# Patient Record
Sex: Male | Born: 1940
Health system: Southern US, Community
[De-identification: ages and names within clinical notes are randomized; demographics above are authoritative.]

## PROBLEM LIST (undated history)

## (undated) DIAGNOSIS — Z8619 Personal history of other infectious and parasitic diseases: Secondary | ICD-10-CM

## (undated) HISTORY — DX: Personal history of other infectious and parasitic diseases: Z86.19

---
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
## 1999-02-05 HISTORY — PX: LUMBAR SPINE SURGERY: SHX701

## 1999-09-03 ENCOUNTER — Ambulatory Visit (HOSPITAL_COMMUNITY): Admission: RE | Admit: 1999-09-03 | Discharge: 1999-09-03 | Payer: Self-pay | Admitting: Gastroenterology

## 2001-01-09 ENCOUNTER — Ambulatory Visit (HOSPITAL_COMMUNITY): Admission: RE | Admit: 2001-01-09 | Discharge: 2001-01-10 | Payer: Self-pay | Admitting: Neurosurgery

## 2001-01-09 ENCOUNTER — Encounter: Payer: Self-pay | Admitting: Neurosurgery

## 2003-02-03 ENCOUNTER — Encounter: Admission: RE | Admit: 2003-02-03 | Discharge: 2003-02-03 | Payer: Self-pay | Admitting: Internal Medicine

## 2003-05-16 ENCOUNTER — Encounter: Admission: RE | Admit: 2003-05-16 | Discharge: 2003-05-16 | Payer: Self-pay | Admitting: Internal Medicine

## 2003-05-20 ENCOUNTER — Encounter: Admission: RE | Admit: 2003-05-20 | Discharge: 2003-05-20 | Payer: Self-pay | Admitting: Internal Medicine

## 2005-02-13 ENCOUNTER — Encounter: Admission: RE | Admit: 2005-02-13 | Discharge: 2005-02-13 | Payer: Self-pay | Admitting: Internal Medicine

## 2006-03-26 ENCOUNTER — Ambulatory Visit (HOSPITAL_BASED_OUTPATIENT_CLINIC_OR_DEPARTMENT_OTHER): Admission: RE | Admit: 2006-03-26 | Discharge: 2006-03-26 | Payer: Self-pay | Admitting: Orthopedic Surgery

## 2006-11-04 ENCOUNTER — Ambulatory Visit: Payer: Self-pay | Admitting: Specialist

## 2007-02-05 HISTORY — PX: SHOULDER SURGERY: SHX246

## 2007-02-25 ENCOUNTER — Other Ambulatory Visit: Payer: Self-pay

## 2007-02-25 ENCOUNTER — Ambulatory Visit: Payer: Self-pay | Admitting: Specialist

## 2007-03-12 ENCOUNTER — Ambulatory Visit: Payer: Self-pay | Admitting: Specialist

## 2008-02-05 HISTORY — PX: HERNIA REPAIR: SHX51

## 2008-04-27 DIAGNOSIS — R6882 Decreased libido: Secondary | ICD-10-CM | POA: Insufficient documentation

## 2008-04-27 DIAGNOSIS — J309 Allergic rhinitis, unspecified: Secondary | ICD-10-CM | POA: Insufficient documentation

## 2008-04-28 DIAGNOSIS — Z800 Family history of malignant neoplasm of digestive organs: Secondary | ICD-10-CM | POA: Insufficient documentation

## 2008-05-06 DIAGNOSIS — E291 Testicular hypofunction: Secondary | ICD-10-CM | POA: Insufficient documentation

## 2008-09-01 DIAGNOSIS — K644 Residual hemorrhoidal skin tags: Secondary | ICD-10-CM | POA: Insufficient documentation

## 2009-01-31 ENCOUNTER — Ambulatory Visit: Payer: Self-pay | Admitting: General Surgery

## 2011-04-05 HISTORY — PX: CATARACT EXTRACTION: SUR2

## 2013-02-15 DIAGNOSIS — Z85828 Personal history of other malignant neoplasm of skin: Secondary | ICD-10-CM

## 2013-02-15 DIAGNOSIS — C4492 Squamous cell carcinoma of skin, unspecified: Secondary | ICD-10-CM

## 2013-02-15 HISTORY — DX: Squamous cell carcinoma of skin, unspecified: C44.92

## 2013-02-15 HISTORY — DX: Personal history of other malignant neoplasm of skin: Z85.828

## 2013-05-31 ENCOUNTER — Ambulatory Visit: Payer: Self-pay | Admitting: Gastroenterology

## 2013-05-31 LAB — HM COLONOSCOPY

## 2013-07-29 LAB — HEMOGLOBIN A1C: HEMOGLOBIN A1C: 5.8 % (ref 4.0–6.0)

## 2013-07-29 LAB — LIPID PANEL
Cholesterol: 195 mg/dL (ref 0–200)
HDL: 35 mg/dL (ref 35–70)
LDL Cholesterol: 126 mg/dL
TRIGLYCERIDES: 170 mg/dL — AB (ref 40–160)

## 2013-07-29 LAB — PSA: PSA: 0.5

## 2014-08-29 ENCOUNTER — Encounter: Payer: Self-pay | Admitting: Family Medicine

## 2014-08-29 ENCOUNTER — Ambulatory Visit (INDEPENDENT_AMBULATORY_CARE_PROVIDER_SITE_OTHER): Payer: Medicare Other | Admitting: Family Medicine

## 2014-08-29 VITALS — BP 124/64 | HR 70 | Temp 98.0°F | Resp 16 | Wt 154.0 lb

## 2014-08-29 DIAGNOSIS — S86812A Strain of other muscle(s) and tendon(s) at lower leg level, left leg, initial encounter: Secondary | ICD-10-CM

## 2014-08-29 DIAGNOSIS — L03114 Cellulitis of left upper limb: Secondary | ICD-10-CM

## 2014-08-29 DIAGNOSIS — Z86010 Personal history of colonic polyps: Secondary | ICD-10-CM | POA: Insufficient documentation

## 2014-08-29 DIAGNOSIS — R7303 Prediabetes: Secondary | ICD-10-CM | POA: Insufficient documentation

## 2014-08-29 DIAGNOSIS — R42 Dizziness and giddiness: Secondary | ICD-10-CM | POA: Insufficient documentation

## 2014-08-29 DIAGNOSIS — S86912A Strain of unspecified muscle(s) and tendon(s) at lower leg level, left leg, initial encounter: Secondary | ICD-10-CM

## 2014-08-29 DIAGNOSIS — L989 Disorder of the skin and subcutaneous tissue, unspecified: Secondary | ICD-10-CM | POA: Insufficient documentation

## 2014-08-29 MED ORDER — CEPHALEXIN 500 MG PO CAPS: 500.0000 mg | ORAL_CAPSULE | Freq: Four times a day (QID) | ORAL | Status: AC

## 2014-08-29 NOTE — Progress Notes (Signed)
       Patient: CASANOVA SCHURMAN Male    DOB: [birth year]   74 y.o.   MRN: [id number] Visit Date: 08/29/2014  Today's Provider: Lelon Huh, MD   Chief Complaint  Patient presents with  . Knee Pain    x 1 week (left knee)   Subjective:    Knee Pain  Incident onset: 1 week. There was no injury mechanism. The pain is present in the left knee. The quality of the pain is described as aching. The pain is at a severity of 4/10. The pain has been intermittent since onset. Associated symptoms include tingling. Pertinent negatives include no inability to bear weight, loss of motion, loss of sensation, muscle weakness or numbness. The symptoms are aggravated by movement (bending the knee or walking). He has tried NSAIDs for the symptoms. The treatment provided mild relief.     Elbow pain.   Patient has swelling and pain in his left elbow. Elbow is tender to touch. Patient states the pain has worsened since it started 1 week ago. Patient denies any injury.   Allergies  Allergen Reactions  . Pollen Extract    Previous Medications   ASPIRIN 81 MG TABLET    Take 1 tablet by mouth daily.   CETIRIZINE HCL (ZYRTEC ALLERGY) 10 MG CAPS    Take 1 tablet by mouth daily.   FLUTICASONE (FLONASE) 50 MCG/ACT NASAL SPRAY    Place 2 sprays into both nostrils daily.   LORATADINE (CLARITIN) 10 MG TABLET    Take 1 tablet by mouth daily as needed.   MULTIPLE VITAMINS-MINERALS (MULTIVITAMIN ADULT PO)    Take 1 tablet by mouth daily.   OMEGA-3 FATTY ACIDS (FISH OIL PO)    Take 1 capsule by mouth daily.    Review of Systems  Constitutional: Negative for fever, chills, diaphoresis and fatigue.  Cardiovascular: Negative for leg swelling.  Musculoskeletal: Positive for joint swelling (left elbow), arthralgias (left knee and left elbow) and gait problem.  Neurological: Positive for tingling. Negative for numbness.    History  Substance Use Topics  . Smoking status: Former Smoker -- 1.00 packs/day for 2 years      Types: Cigarettes    Quit date: 02/04/1958  . Smokeless tobacco: Not on file  . Alcohol Use: 0.0 oz/week    0 Standard drinks or equivalent per week     Comment: occasional use; drinks hard liquor once a week or less   Objective:   BP 124/64 mmHg  Pulse 70  Temp(Src) 98 F (36.7 C) (Oral)  Resp 16  Wt 154 lb (69.854 kg)  SpO2 96%  Physical Exam   Slight erythema and tenderness just distal to left olecranon. FROM of elbow wrist and forearm  Mild tenderness inferior to right anterolateral patella. No swelling or erythema. FROM with normal strength of extension.      Assessment & Plan:     1. Cellulitis of arm, left  - cephALEXin (KEFLEX) 500 MG capsule; Take 1 capsule (500 mg total) by mouth 4 (four) times daily.  Dispense: 28 capsule; Refill: 0  2. Knee strain, left, initial encounter Recommend OTC elastic knee brace for 7-10 days.         Lelon Huh, MD  [facility]

## 2014-09-30 ENCOUNTER — Encounter: Payer: Self-pay | Admitting: Family Medicine

## 2014-09-30 ENCOUNTER — Ambulatory Visit (INDEPENDENT_AMBULATORY_CARE_PROVIDER_SITE_OTHER): Payer: Medicare Other | Admitting: Family Medicine

## 2014-09-30 VITALS — BP 118/76 | HR 76 | Temp 98.1°F | Resp 16 | Ht 67.5 in | Wt 154.8 lb

## 2014-09-30 DIAGNOSIS — M2550 Pain in unspecified joint: Secondary | ICD-10-CM

## 2014-09-30 DIAGNOSIS — Z125 Encounter for screening for malignant neoplasm of prostate: Secondary | ICD-10-CM

## 2014-09-30 DIAGNOSIS — Z0000 Encounter for general adult medical examination without abnormal findings: Secondary | ICD-10-CM

## 2014-09-30 DIAGNOSIS — M25532 Pain in left wrist: Secondary | ICD-10-CM

## 2014-09-30 DIAGNOSIS — M25531 Pain in right wrist: Secondary | ICD-10-CM

## 2014-09-30 DIAGNOSIS — R7309 Other abnormal glucose: Secondary | ICD-10-CM | POA: Diagnosis not present

## 2014-09-30 DIAGNOSIS — R7303 Prediabetes: Secondary | ICD-10-CM

## 2014-09-30 MED ORDER — PREDNISONE 10 MG PO TABS: ORAL_TABLET | ORAL | Status: AC

## 2014-09-30 NOTE — Patient Instructions (Addendum)
Please call office in September to schedule your flu shot and Prevnar-13 vaccine

## 2014-09-30 NOTE — Progress Notes (Signed)
Patient ID: Gregory Arroyo, male   DOB: 19-Jan-1941, 74 y.o.   MRN: [id number]       Patient: Gregory Arroyo, Male    DOB: 27-Jan-1941, 74 y.o.   MRN: [id number] Visit Date: 09/30/2014  Today's Provider: Lelon Huh, MD   Chief Complaint  Patient presents with  . Annual Exam    " 2 years ago" pt stated  . Wrist Pain    bilateral, started a week ago. Pain 6 out of a 10.   Subjective:    Annual physical exam Gregory Arroyo is a 74 y.o. male who presents today for health maintenance and complete physical. He feels well. 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 He reports he is sleeping poorly. Sleeping poorly because of the pain. -----------------------------------------------------------------   Joint pain He is concerned about migrating joint and muscle pains over the last few months. He was seen in July with pain in left elbow and left knee. He appeared to have some mild cellulitis in his elbow and was prescribed cephalexin. He states hat since then both pains have resolved, but he started having pain in both wrists a few weeks ago. The pain is worse in his left wrist, and is very severe in the morning. He states his wrists feel very stiff when he gets up in the morning and it take a few hours to lossen up.   Review of Systems  Constitutional: Negative.  Negative for fever, chills, appetite change and fatigue.  HENT: Negative for congestion, ear pain, hearing loss, nosebleeds and trouble swallowing.        Hearing loss, wear hearing aids on bilateral ears.  Eyes: Negative.  Negative for pain and visual disturbance.  Respiratory: Negative.  Negative for cough, chest tightness and shortness of breath.   Cardiovascular: Negative.  Negative for chest pain, palpitations and leg swelling.  Gastrointestinal: Negative.  Negative for nausea, vomiting, abdominal pain, diarrhea, constipation and blood in stool.  Endocrine: Negative.  Negative for  polydipsia, polyphagia and polyuria.  Genitourinary: Negative.  Negative for dysuria and flank pain.  Musculoskeletal: Positive for myalgias, back pain and arthralgias. Negative for neck stiffness.       Joint pain and muscle pain on bilateral wrists.  Skin: Negative.  Negative for color change, rash and wound.  Allergic/Immunologic: Negative.   Neurological: Negative.  Negative for dizziness, tremors, seizures, speech difficulty, weakness, light-headedness and headaches.  Hematological: Negative.   Psychiatric/Behavioral: Positive for sleep disturbance. Negative for behavioral problems, confusion, dysphoric mood and decreased concentration. The patient is not nervous/anxious.     Social History He  reports that he quit smoking about 56 years ago. His smoking use included Cigarettes. He has a 2 pack-year smoking history. He does not have any smokeless tobacco history on file. He reports that he drinks alcohol. He reports that he does not use illicit drugs. Social History   Social History  . Marital Status: Married    Spouse Name: N/A  . Number of Children: 4  . Years of Education: N/A   Occupational History  . 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 Smoking status: Former Smoker -- 1.00 packs/day for 2 years    Types: Cigarettes    Quit date: 02/04/1958  . Smokeless tobacco: None  . Alcohol Use: 0.0 oz/week    0 Standard drinks or equivalent  per week     Comment: occasional use; drinks hard liquor once a week or less  . Drug Use: No  . Sexual Activity: Not Asked   Other Topics Concern  . None   Social History Narrative    Patient Active Problem List   Diagnosis Date Noted  . H/O adenomatous polyp of colon 08/29/2014  . Pre-diabetes 08/29/2014  . Skin lesion of face 08/29/2014  . Vertigo 08/29/2014  . External hemorrhoids without complication 59/16/3846  . Testicular  hypofunction 05/06/2008  . Family history of colon cancer 04/28/2008  . Allergic rhinitis 04/27/2008  . Decreased libido 04/27/2008    Past Surgical History  Procedure Laterality Date  . Hernia repair Right 2010    Dr. Bary Castilla; Inguinal Hernia  . Shoulder surgery Left 2009    Dr. Sabra Heck  . Lumbar spine surgery  2001  . Circumcision  1954  . Cataract extraction Right 04/2011    Dr. Martie Round, [place]    Family History  Family Status  Relation Status Death Age  . Mother Deceased 58    Cause of death: Stroke  . Father Deceased 74    Cause of death: Heart attack  . Sister Alive   . Sister Alive   . Sister Alive    His family history includes Colon cancer (age of onset: 29) in his sister; Heart attack in his father; Heart disease in his father and mother; Stroke in his mother; Transient ischemic attack in his mother.    Allergies  Allergen Reactions  . Pollen Extract     Previous Medications   ASPIRIN 81 MG TABLET    Take 1 tablet by mouth daily.   CETIRIZINE HCL (ZYRTEC ALLERGY) 10 MG CAPS    Take 1 tablet by mouth daily.   FLUTICASONE (FLONASE) 50 MCG/ACT NASAL SPRAY    Place 2 sprays into both nostrils daily.   LORATADINE (CLARITIN) 10 MG TABLET    Take 1 tablet by mouth daily as needed.   MULTIPLE VITAMINS-MINERALS (MULTIVITAMIN ADULT PO)    Take 1 tablet by mouth daily.   OMEGA-3 FATTY ACIDS (FISH OIL PO)    Take 1 capsule by mouth daily.    Patient Care Team: Birdie Sons, MD as PCP - General (Family Medicine)     Objective:   Vitals: BP 118/76 mmHg  Pulse 76  Temp(Src) 98.1 F (36.7 C) (Oral)  Resp 16  Ht 5' 7.5" (1.715 m)  Wt 154 lb 12.8 oz (70.217 kg)  BMI 23.87 kg/m2   Physical Exam   General Appearance:    Alert, cooperative, no distress, appears stated age  Head:    Normocephalic, without obvious abnormality, atraumatic  Eyes:    PERRL, conjunctiva/corneas clear, EOM's intact, fundi    benign, both eyes       Ears:    Normal TM's and  external ear canals, both ears  Nose:   Nares normal, septum midline, mucosa normal, no drainage   or sinus tenderness  Throat:   Lips, mucosa, and tongue normal; teeth and gums normal  Neck:   Supple, symmetrical, trachea midline, no adenopathy;       thyroid:  No enlargement/tenderness/nodules; no carotid   bruit or JVD  Back:     Symmetric, no curvature, ROM normal, no CVA tenderness  Lungs:     Clear to auscultation bilaterally, respirations unlabored  Chest wall:    No tenderness or deformity  Heart:    Regular rate and rhythm,  S1 and S2 normal, no murmur, rub   or gallop  Abdomen:     Soft, non-tender, bowel sounds active all four quadrants,    no masses, no organomegaly  Genitalia:    deferred  Rectal:    deferred  Extremities:   Extremities normal, atraumatic, no cyanosis or edema  Pulses:   2+ and symmetric all extremities  Skin:   Skin color, texture, turgor normal, no rashes or lesions  Lymph nodes:   Cervical, supraclavicular, and axillary nodes normal  Neurologic:   CNII-XII intact. Normal strength, sensation and reflexes      throughout    Depression Screen PHQ 2/9 Scores 09/30/2014 09/30/2014 09/30/2014  PHQ - 2 Score 0 0 0  PHQ- 9 Score 0 - -   Cognitive Testing - 6-CIT  Correct? Score   What year is it? yes 0 0 or 4  What month is it? yes 0 0 or 3  Memorize:    Pia Mau,  [birth year],  [place],      What time is it? (within 1 hour) yes 0 0 or 3  Count backwards from 20 yes 0 0, 2, or 4  Name the months of the year yes 2 0, 2, or 4  Repeat name & address above yes 6 0, 2, 4, 6, 8, or 10       TOTAL SCORE  8/28   Interpretation:  Abnormal-    Normal (0-7) Abnormal (8-28)      Assessment & Plan:     Routine Health Maintenance and Physical Exam  Exercise Activities and Dietary recommendations Goals    None      Immunization History  Administered Date(s) Administered  . Pneumococcal Polysaccharide-23 04/27/2008  . Tdap 05/27/2011  . Zoster  05/27/2011    Health Maintenance  Topic Date Due  . PNA vac Low Risk Adult (2 of 2 - PCV13) 04/27/2009  . INFLUENZA VACCINE  09/05/2014  . TETANUS/TDAP  05/26/2021  . COLONOSCOPY  06/01/2023  . ZOSTAVAX  Completed      Discussed health benefits of physical activity, and encouraged him to engage in regular exercise appropriate for his age and condition.    --------------------------------------------------------------------  1. Annual physical exam   2. Pre-diabetes  - Hemoglobin I4P - Basic metabolic panel  3. Prostate cancer screening  - PSA  4. Bilateral wrist pain   5. Arthralgia  - ANA w/Reflex if Positive - Sed Rate (ESR) - Rheumatoid factor - Uric acid - C-reactive protein - predniSONE (DELTASONE) 10 MG tablet; 6 tablets for 2 days, then 5 for 2 days, then 4 for 2 days, then 3 for 2 days, then 2 for 2 days, then 1 for 2 days.  Dispense: 42 tablet; Refill: 0

## 2014-10-01 LAB — BASIC METABOLIC PANEL
BUN/Creatinine Ratio: 17 (ref 10–22)
BUN: 18 mg/dL (ref 8–27)
CO2: 29 mmol/L (ref 18–29)
CREATININE: 1.05 mg/dL (ref 0.76–1.27)
Calcium: 9.7 mg/dL (ref 8.6–10.2)
Chloride: 99 mmol/L (ref 97–108)
GFR calc Af Amer: 80 mL/min/{1.73_m2} (ref >59)
GFR calc non Af Amer: 70 mL/min/{1.73_m2} (ref >59)
GLUCOSE: 93 mg/dL (ref 65–99)
Potassium: 4.4 mmol/L (ref 3.5–5.2)
SODIUM: 140 mmol/L (ref 134–144)

## 2014-10-01 LAB — HEMOGLOBIN A1C
Est. average glucose Bld gHb Est-mCnc: 126 mg/dL
Hgb A1c MFr Bld: 6 % — ABNORMAL HIGH (ref 4.8–5.6)

## 2014-10-01 LAB — ANA W/REFLEX IF POSITIVE
ANA: POSITIVE — AB
Anti JO-1: <0.2 AI (ref 0.0–0.9)
Chromatin Ab SerPl-aCnc: 0.5 AI (ref 0.0–0.9)
DSDNA AB: 2 [IU]/mL (ref 0–9)
ENA SM Ab Ser-aCnc: 0.4 AI (ref 0.0–0.9)
ENA SSA (RO) Ab: >8 AI — ABNORMAL HIGH (ref 0.0–0.9)
Scleroderma SCL-70: <0.2 AI (ref 0.0–0.9)

## 2014-10-01 LAB — PSA: Prostate Specific Ag, Serum: 0.5 ng/mL (ref 0.0–4.0)

## 2014-10-01 LAB — C-REACTIVE PROTEIN: CRP: 4.3 mg/L (ref 0.0–4.9)

## 2014-10-01 LAB — RHEUMATOID FACTOR: RHEUMATOID FACTOR: 13.8 [IU]/mL (ref 0.0–13.9)

## 2014-10-01 LAB — SEDIMENTATION RATE: SED RATE: 6 mm/h (ref 0–30)

## 2014-10-01 LAB — URIC ACID: Uric Acid: 6.8 mg/dL (ref 3.7–8.6)

## 2014-10-02 ENCOUNTER — Other Ambulatory Visit: Payer: Self-pay | Admitting: Family Medicine

## 2014-10-02 DIAGNOSIS — M2550 Pain in unspecified joint: Secondary | ICD-10-CM

## 2014-10-02 DIAGNOSIS — M25532 Pain in left wrist: Secondary | ICD-10-CM

## 2014-10-02 DIAGNOSIS — R768 Other specified abnormal immunological findings in serum: Secondary | ICD-10-CM

## 2014-10-02 DIAGNOSIS — M25531 Pain in right wrist: Secondary | ICD-10-CM

## 2014-10-18 ENCOUNTER — Encounter: Payer: Self-pay | Admitting: Family Medicine

## 2014-10-18 ENCOUNTER — Ambulatory Visit (INDEPENDENT_AMBULATORY_CARE_PROVIDER_SITE_OTHER): Payer: Medicare Other | Admitting: Family Medicine

## 2014-10-18 VITALS — BP 122/68 | HR 66 | Temp 98.5°F | Resp 16 | Wt 152.0 lb

## 2014-10-18 DIAGNOSIS — N390 Urinary tract infection, site not specified: Secondary | ICD-10-CM | POA: Diagnosis not present

## 2014-10-18 DIAGNOSIS — R350 Frequency of micturition: Secondary | ICD-10-CM | POA: Diagnosis not present

## 2014-10-18 DIAGNOSIS — N410 Acute prostatitis: Secondary | ICD-10-CM | POA: Diagnosis not present

## 2014-10-18 LAB — POCT URINALYSIS DIPSTICK
Bilirubin, UA: NEGATIVE
GLUCOSE UA: NEGATIVE
Ketones, UA: NEGATIVE
Leukocytes, UA: NEGATIVE
NITRITE UA: NEGATIVE
PROTEIN UA: NEGATIVE
RBC UA: NEGATIVE
SPEC GRAV UA: 1.01
UROBILINOGEN UA: 0.2
pH, UA: 6

## 2014-10-18 MED ORDER — CIPROFLOXACIN HCL 500 MG PO TABS: 500.0000 mg | ORAL_TABLET | Freq: Two times a day (BID) | ORAL | Status: AC

## 2014-10-18 NOTE — Progress Notes (Signed)
       Patient: Gregory Arroyo Male    DOB: [birth year]   74 y.o.   MRN: [id number] Visit Date: 10/18/2014  Today's Provider: Lelon Huh, MD   Chief Complaint  Patient presents with  . Urinary Frequency   Subjective:    Urinary Frequency  This is a new problem. The current episode started in the past 7 days. The problem occurs every urination. The problem has been unchanged. The quality of the pain is described as aching. The pain is mild. There has been no fever. Associated symptoms include flank pain, frequency and urgency. Pertinent negatives include no nausea.  there is no burning with urination. Has some difficulty starting stream and seams to have weak flow when he voids. Having to get up about 3 times a night to void, but only small amount of urine flows.     Allergies  Allergen Reactions  . Pollen Extract    Previous Medications   ASPIRIN 81 MG TABLET    Take 1 tablet by mouth daily.   CETIRIZINE HCL (ZYRTEC ALLERGY) 10 MG CAPS    Take 1 tablet by mouth daily.   FLUTICASONE (FLONASE) 50 MCG/ACT NASAL SPRAY    Place 2 sprays into both nostrils daily.   LORATADINE (CLARITIN) 10 MG TABLET    Take 1 tablet by mouth daily as needed.   MULTIPLE VITAMINS-MINERALS (MULTIVITAMIN ADULT PO)    Take 1 tablet by mouth daily.   OMEGA-3 FATTY ACIDS (FISH OIL PO)    Take 1 capsule by mouth daily.    Review of Systems  Cardiovascular: Negative.   Gastrointestinal: Negative for nausea.  Genitourinary: Positive for urgency, frequency and flank pain.    Social History  Substance Use Topics  . Smoking status: Former Smoker -- 1.00 packs/day for 2 years    Types: Cigarettes    Quit date: 02/04/1958  . Smokeless tobacco: Not on file  . Alcohol Use: 0.0 oz/week    0 Standard drinks or equivalent per week     Comment: occasional use; drinks hard liquor once a week or less   Objective:   BP 122/68 mmHg  Pulse 66  Temp(Src) 98.5 F (36.9 C) (Oral)  Resp 16  Wt 152 lb (68.947  kg)  SpO2 99%  Physical Exam  General appearance: alert, well developed, well nourished, cooperative and in no distress Head: Normocephalic, without obvious abnormality, atraumatic Lungs: Respirations even and unlabored Extremities: No gross deformities Skin: Skin color, texture, turgor normal. No rashes seen  Psych: Appropriate mood and affect. Neurologic: Mental status: Alert, oriented to person, place, and time, thought content appropriate.     Assessment & Plan:     1. Urinary frequency  - POCT urinalysis dipstick - Urine Culture 2. Acute prostatitis  - ciprofloxacin (CIPRO) 500 MG tablet; Take 1 tablet (500 mg total) by mouth 2 (two) times daily.  Dispense: 28 tablet; Refill: 0       Lelon Huh, MD  [facility]

## 2014-10-20 LAB — URINE CULTURE

## 2015-01-04 ENCOUNTER — Telehealth: Payer: Self-pay | Admitting: Family Medicine

## 2015-01-04 NOTE — Telephone Encounter (Signed)
Pt is requesting a pneumonia and and a flu shot.  Please advise if this can be scheduled.  [phone]

## 2015-01-05 NOTE — Telephone Encounter (Signed)
[place] for patient to get pneumonia and flu vaccine?

## 2015-01-05 NOTE — Telephone Encounter (Signed)
Gregory Arroyo, can you please schedule patient for flu and pneumonia shot? Thanks!

## 2015-01-05 NOTE — Telephone Encounter (Signed)
Needs high dose flu vaccine and Prevnar-13

## 2015-01-06 ENCOUNTER — Ambulatory Visit (INDEPENDENT_AMBULATORY_CARE_PROVIDER_SITE_OTHER): Payer: Medicare Other

## 2015-01-06 DIAGNOSIS — Z23 Encounter for immunization: Secondary | ICD-10-CM

## 2015-01-07 NOTE — Telephone Encounter (Signed)
I contacted pt on 01/05/15 and scheduled the appt mentioned below on 01/06/15. Thanks TNP

## 2015-03-22 ENCOUNTER — Ambulatory Visit (INDEPENDENT_AMBULATORY_CARE_PROVIDER_SITE_OTHER): Payer: Medicare Other | Admitting: Family Medicine

## 2015-03-22 ENCOUNTER — Encounter: Payer: Self-pay | Admitting: Family Medicine

## 2015-03-22 VITALS — BP 140/70 | HR 80 | Temp 98.2°F | Resp 16 | Ht 67.5 in | Wt 155.0 lb

## 2015-03-22 DIAGNOSIS — J0110 Acute frontal sinusitis, unspecified: Secondary | ICD-10-CM | POA: Diagnosis not present

## 2015-03-22 MED ORDER — AMOXICILLIN 500 MG PO CAPS: 1000.0000 mg | ORAL_CAPSULE | Freq: Two times a day (BID) | ORAL | Status: AC

## 2015-03-22 NOTE — Patient Instructions (Signed)
Start using your nasal spray every day  If you are not getting better within 3 more days, or if you develop any fever or worsening sinus pain, you should start taking the prescription antibiotic

## 2015-03-22 NOTE — Progress Notes (Signed)
Patient: Gregory Arroyo Male    DOB: [birth year]   75 y.o.   MRN: [id number] Visit Date: 03/22/2015  Today's Provider: Lelon Huh, MD   Chief Complaint  Patient presents with  . Sinusitis   Subjective:    Sinusitis This is a new problem. The current episode started in the past 7 days (3 days). The problem has been gradually worsening since onset. There has been no fever. He is experiencing no pain. Associated symptoms include congestion, coughing, ear pain, headaches, a hoarse voice, shortness of breath and sinus pressure. Pertinent negatives include no chills, neck pain, sneezing, sore throat or swollen glands. Past treatments include nothing. The treatment provided no relief.    Sinus drainage started 3 days. Sinus pressure, ear fullness, nasal drainage, scratchy throat, headaches.    Allergies  Allergen Reactions  . Pollen Extract    Previous Medications   ASPIRIN 81 MG TABLET    Take 1 tablet by mouth daily.   CETIRIZINE HCL (ZYRTEC ALLERGY) 10 MG CAPS    Take 1 tablet by mouth daily.   FLUTICASONE (FLONASE) 50 MCG/ACT NASAL SPRAY    Place 2 sprays into both nostrils daily.   LORATADINE (CLARITIN) 10 MG TABLET    Take 1 tablet by mouth daily as needed.   MULTIPLE VITAMINS-MINERALS (MULTIVITAMIN ADULT PO)    Take 1 tablet by mouth daily.   OMEGA-3 FATTY ACIDS (FISH OIL PO)    Take 1 capsule by mouth daily.    Review of Systems  Constitutional: Negative for fever, chills and appetite change.  HENT: Positive for congestion, ear pain, hoarse voice and sinus pressure. Negative for ear discharge, sneezing and sore throat.        Ear fullness bilateral with pressure  Respiratory: Positive for cough and shortness of breath. Negative for chest tightness and wheezing.   Cardiovascular: Negative for chest pain and palpitations.  Gastrointestinal: Negative for nausea, vomiting and abdominal pain.  Musculoskeletal: Negative for neck pain.  Neurological: Positive for  headaches.    Social History  Substance Use Topics  . Smoking status: Former Smoker -- 1.00 packs/day for 2 years    Types: Cigarettes    Quit date: 02/04/1958  . Smokeless tobacco: Not on file  . Alcohol Use: 0.0 oz/week    0 Standard drinks or equivalent per week     Comment: occasional use; drinks hard liquor once a week or less   Objective:   BP 140/70 mmHg  Pulse 80  Temp(Src) 98.2 F (36.8 C) (Oral)  Resp 16  Ht 5' 7.5" (1.715 m)  Wt 155 lb (70.308 kg)  BMI 23.90 kg/m2  SpO2 97%  Physical Exam  General Appearance:    Alert, cooperative, no distress  HENT:   bilateral TM normal without fluid or infection, throat normal without erythema or exudate, sinuses nontender and nasal mucosa pale and congested  Eyes:    PERRL, conjunctiva/corneas clear, EOM's intact       Lungs:     Clear to auscultation bilaterally, respirations unlabored  Heart:    Regular rate and rhythm  Neurologic:   Awake, alert, oriented x 3. No apparent focal neurological           defect.           Assessment & Plan:     1. Acute frontal sinusitis, recurrence not specified He has a Nasacort inhaler at home which he is to start using. Counseled regarding signs and symptoms  of viral and bacterial respiratory infections. Advised to start amoxicillin prescription if he develops any sign of bacterial infection, or if current symptoms last longer than 7 days.         Lelon Huh, MD  [facility]

## 2015-04-06 ENCOUNTER — Other Ambulatory Visit: Payer: Self-pay | Admitting: Family Medicine

## 2015-04-06 MED ORDER — FLUTICASONE PROPIONATE 50 MCG/ACT NA SUSP: 2.0000 | Freq: Every day | NASAL | Status: DC

## 2015-04-06 NOTE — Telephone Encounter (Signed)
Pt contacted office for refill request on the following medications:  fluticasone (FLONASE) 50 MCG/ACT nasal spray.  [employer].  [phone]

## 2015-04-17 ENCOUNTER — Encounter: Payer: Self-pay | Admitting: Family Medicine

## 2015-04-17 ENCOUNTER — Ambulatory Visit (INDEPENDENT_AMBULATORY_CARE_PROVIDER_SITE_OTHER): Payer: Medicare Other | Admitting: Family Medicine

## 2015-04-17 VITALS — BP 138/78 | HR 78 | Temp 97.8°F | Resp 18 | Wt 154.0 lb

## 2015-04-17 DIAGNOSIS — J329 Chronic sinusitis, unspecified: Secondary | ICD-10-CM | POA: Insufficient documentation

## 2015-04-17 DIAGNOSIS — J321 Chronic frontal sinusitis: Secondary | ICD-10-CM

## 2015-04-17 MED ORDER — PREDNISONE 10 MG PO TABS: ORAL_TABLET | ORAL | Status: DC

## 2015-04-17 MED ORDER — AZITHROMYCIN 250 MG PO TABS: ORAL_TABLET | ORAL | Status: AC

## 2015-04-17 NOTE — Progress Notes (Signed)
Patient: Gregory Arroyo Male    DOB: 10/24/1940   75 y.o.   MRN: [id number] Visit Date: 04/17/2015  Today's Provider: Lelon Huh, MD   Chief Complaint  Patient presents with  . Cough  . Nasal Congestion   Subjective:    Cough This is a new problem. Episode onset: 2 weeks ago. The problem has been gradually worsening. Associated symptoms include nasal congestion. Pertinent negatives include no chest pain, chills, fever, shortness of breath or wheezing.    He was seen about a month ago for above symptoms and advised to treat symptomatically with nasal saline and Nasocort. He was given rx for amoxicillin but did not fill initially because he was improving, but decided to start abx about 4 days ago since it was getting worse again. He had some low grade fevers yesterday, but feels a little better today.      Allergies  Allergen Reactions  . Pollen Extract    Previous Medications   AMOXICILLIN (AMOXIL) 500 MG CAPSULE    Take 1,000 mg by mouth 2 (two) times daily.   ASPIRIN 81 MG TABLET    Take 1 tablet by mouth daily.   CETIRIZINE HCL (ZYRTEC ALLERGY) 10 MG CAPS    Take 1 tablet by mouth daily.   FLUTICASONE (FLONASE) 50 MCG/ACT NASAL SPRAY    Place 2 sprays into both nostrils daily.   LORATADINE (CLARITIN) 10 MG TABLET    Take 1 tablet by mouth daily as needed.   MULTIPLE VITAMINS-MINERALS (MULTIVITAMIN ADULT PO)    Take 1 tablet by mouth daily.   OMEGA-3 FATTY ACIDS (FISH OIL PO)    Take 1 capsule by mouth daily.    Review of Systems  Constitutional: Negative for fever, chills and appetite change.  HENT: Positive for congestion and sinus pressure.   Respiratory: Positive for cough. Negative for chest tightness, shortness of breath and wheezing.   Cardiovascular: Negative for chest pain and palpitations.  Gastrointestinal: Negative for nausea, vomiting and abdominal pain.    Social History  Substance Use Topics  . Smoking status: Former Smoker -- 1.00 packs/day  for 2 years    Types: Cigarettes    Quit date: 02/04/1958  . Smokeless tobacco: Not on file  . Alcohol Use: 0.0 oz/week    0 Standard drinks or equivalent per week     Comment: occasional use; drinks hard liquor once a week or less   Objective:   BP 138/78 mmHg  Pulse 78  Temp(Src) 97.8 F (36.6 C)  Resp 18  Wt 154 lb (69.854 kg)  Physical Exam  General Appearance:    Alert, cooperative, no distress  HENT:   bilateral TM normal without fluid or infection, neck without nodes, throat normal without erythema or exudate, frontal sinuses tender and nasal mucosa pale and congested  Eyes:    PERRL, conjunctiva/corneas clear, EOM's intact       Lungs:     Clear to auscultation bilaterally, respirations unlabored  Heart:    Regular rate and rhythm  Neurologic:   Awake, alert, oriented x 3. No apparent focal neurological           defect.            Assessment & Plan:     1. Frontal sinusitis, unspecified chronicity He is anxious because he is living town for a cruise in 4 days. Counseled that abx may take 3-4 days to have an effect and he has improved  a bit today. He if does not continue to significantly improve over the next 24 hours then he is to change from amoxcillin to azithromycin and take short taper of prednisone.  - azithromycin (ZITHROMAX) 250 MG tablet; 2 by mouth today, then 1 daily for 4 days  Dispense: 6 tablet; Refill: 0 - predniSONE (DELTASONE) 10 MG tablet; 6 tablets for 1 day, then 5 for 1 day, then 4 for 1 day, then 3 for 1 day, then 2 for 1 day then 1 for 1 day.  Dispense: 21 tablet; Refill: 0       Lelon Huh, MD  [facility]

## 2015-05-06 ENCOUNTER — Encounter: Payer: Self-pay | Admitting: Emergency Medicine

## 2015-05-06 ENCOUNTER — Emergency Department
Admission: EM | Admit: 2015-05-06 | Discharge: 2015-05-06 | Disposition: A | Discharge status: Home / Self Care | Payer: Medicare Other | Attending: Student | Admitting: Student

## 2015-05-06 ENCOUNTER — Emergency Department: Payer: Medicare Other

## 2015-05-06 DIAGNOSIS — Z79899 Other long term (current) drug therapy: Secondary | ICD-10-CM | POA: Diagnosis not present

## 2015-05-06 DIAGNOSIS — Z87891 Personal history of nicotine dependence: Secondary | ICD-10-CM | POA: Insufficient documentation

## 2015-05-06 DIAGNOSIS — R0602 Shortness of breath: Secondary | ICD-10-CM | POA: Diagnosis present

## 2015-05-06 DIAGNOSIS — J40 Bronchitis, not specified as acute or chronic: Secondary | ICD-10-CM

## 2015-05-06 DIAGNOSIS — Z7951 Long term (current) use of inhaled steroids: Secondary | ICD-10-CM | POA: Insufficient documentation

## 2015-05-06 DIAGNOSIS — Z792 Long term (current) use of antibiotics: Secondary | ICD-10-CM | POA: Insufficient documentation

## 2015-05-06 DIAGNOSIS — Z7982 Long term (current) use of aspirin: Secondary | ICD-10-CM | POA: Insufficient documentation

## 2015-05-06 LAB — COMPREHENSIVE METABOLIC PANEL
ALK PHOS: 56 U/L (ref 38–126)
ALT: 33 U/L (ref 17–63)
ANION GAP: 5 (ref 5–15)
AST: 34 U/L (ref 15–41)
Albumin: 4.2 g/dL (ref 3.5–5.0)
BILIRUBIN TOTAL: 0.9 mg/dL (ref 0.3–1.2)
BUN: 14 mg/dL (ref 6–20)
CALCIUM: 8.9 mg/dL (ref 8.9–10.3)
CO2: 27 mmol/L (ref 22–32)
CREATININE: 0.97 mg/dL (ref 0.61–1.24)
Chloride: 99 mmol/L — ABNORMAL LOW (ref 101–111)
Glucose, Bld: 99 mg/dL (ref 65–99)
Potassium: 4.1 mmol/L (ref 3.5–5.1)
SODIUM: 131 mmol/L — AB (ref 135–145)
TOTAL PROTEIN: 8.2 g/dL — AB (ref 6.5–8.1)

## 2015-05-06 LAB — CBC WITH DIFFERENTIAL/PLATELET
Basophils Absolute: 0 10*3/uL (ref 0–0.1)
Basophils Relative: 1 %
EOS ABS: 0 10*3/uL (ref 0–0.7)
Eosinophils Relative: 0 %
HCT: 44.6 % (ref 40.0–52.0)
HEMOGLOBIN: 15.3 g/dL (ref 13.0–18.0)
Lymphocytes Relative: 11 %
Lymphs Abs: 0.7 10*3/uL — ABNORMAL LOW (ref 1.0–3.6)
MCH: 31.8 pg (ref 26.0–34.0)
MCHC: 34.2 g/dL (ref 32.0–36.0)
MCV: 93.1 fL (ref 80.0–100.0)
MONOS PCT: 8 %
Monocytes Absolute: 0.5 10*3/uL (ref 0.2–1.0)
NEUTROS ABS: 5.1 10*3/uL (ref 1.4–6.5)
NEUTROS PCT: 80 %
Platelets: 199 10*3/uL (ref 150–440)
RBC: 4.79 MIL/uL (ref 4.40–5.90)
RDW: 13.2 % (ref 11.5–14.5)
WBC: 6.4 10*3/uL (ref 3.8–10.6)

## 2015-05-06 LAB — TROPONIN I

## 2015-05-06 MED ORDER — ALBUTEROL SULFATE HFA 108 (90 BASE) MCG/ACT IN AERS: 2.0000 | INHALATION_SPRAY | Freq: Four times a day (QID) | RESPIRATORY_TRACT | Status: DC | PRN

## 2015-05-06 MED ORDER — BENZONATATE 100 MG PO CAPS: 100.0000 mg | ORAL_CAPSULE | Freq: Three times a day (TID) | ORAL | Status: DC | PRN

## 2015-05-06 MED ORDER — ALBUTEROL SULFATE (2.5 MG/3ML) 0.083% IN NEBU
2.5000 mg | INHALATION_SOLUTION | Freq: Once | RESPIRATORY_TRACT | Status: AC
  Administered 2015-05-06: 2.5 mg via RESPIRATORY_TRACT
  Filled 2015-05-06: qty 3

## 2015-05-06 NOTE — ED Notes (Addendum)
Patient states that he has had a cough for the past 4 weeks. Patient states that he has taken 3 different antibiotics with a little improvement with each course but he is still constantly coughing, patient states that he is getting up yellow sputum. Patient denies blood in his sputum.   Patient states that he had fever of 100 yesterday.  Patient states that he gets Short of breath with exertion

## 2015-05-06 NOTE — ED Provider Notes (Signed)
[facility] Emergency Department Provider Note  ____________________________________________  Time seen: Approximately 10:48 AM  I have reviewed the triage vital signs and the nursing notes.   HISTORY  Chief Complaint Shortness of Breath    HPI Gregory Arroyo is a 75 y.o. male with no chronic medical problems who presents for evaluation of nearly one month of productive cough, improved after he took antibiotics but persistent, currently moderate, no modifying factors. He patient was seen by his primary care doctor at the beginning of March for productive cough, nasal congestion. He was initially treated with amoxicillin which was later changed to azithromycin. He reports he took antibiotics and was feeling better however over the past few days his cough has gotten worse. He is coughing up yellow sputum. He had a fever yesterday with a maximum temperature 100F. No chest pain. He has developed some shortness of breath with exertion since his cough began. No hemoptysis. No abdominal pain, vomiting, diarrhea, chills, dysuria. No history of DVT or PE. No exogenous estrogen use, recent prolonged period of Immobilization or recent surgery.   Past Medical History  Diagnosis Date  . History of chicken pox   . History of measles     Patient Active Problem List   Diagnosis Date Noted  . Sinusitis 04/17/2015  . Bilateral wrist pain 09/30/2014  . H/O adenomatous polyp of colon 08/29/2014  . Pre-diabetes 08/29/2014  . Skin lesion of face 08/29/2014  . Vertigo 08/29/2014  . External hemorrhoids without complication [id number]  . Testicular hypofunction 05/06/2008  . Family history of colon cancer 04/28/2008  . Allergic rhinitis 04/27/2008  . Decreased libido 04/27/2008    Past Surgical History  Procedure Laterality Date  . Hernia repair Right 2010    Dr. Bary Castilla; Inguinal Hernia  . Shoulder surgery Left 2009    Dr. Sabra Heck  . Lumbar spine surgery  2001  .  Circumcision  1954  . Cataract extraction Right 04/2011    Dr. Martie Round, [facility]    Current Outpatient Rx  Name  Route  Sig  Dispense  Refill  . amoxicillin (AMOXIL) 500 MG capsule   Oral   Take 1,000 mg by mouth 2 (two) times daily.      0   . aspirin 81 MG tablet   Oral   Take 1 tablet by mouth daily.         . Cetirizine HCl (ZYRTEC ALLERGY) 10 MG CAPS   Oral   Take 1 tablet by mouth daily.         . fluticasone (FLONASE) 50 MCG/ACT nasal spray   Each Nare   Place 2 sprays into both nostrils daily.   16 g   5   . loratadine (CLARITIN) 10 MG tablet   Oral   Take 1 tablet by mouth daily as needed.         . Multiple Vitamins-Minerals (MULTIVITAMIN ADULT PO)   Oral   Take 1 tablet by mouth daily.         . Omega-3 Fatty Acids (FISH OIL PO)   Oral   Take 1 capsule by mouth daily.           Allergies Pollen extract  Family History  Problem Relation Age of Onset  . Stroke Mother   . Heart disease Mother   . Transient ischemic attack Mother   . Heart attack Father   . Heart disease Father   . Colon cancer Sister 39  Social History Social History  Substance Use Topics  . Smoking status: Former Smoker -- 1.00 packs/day for 2 years    Types: Cigarettes    Quit date: 02/04/1958  . Smokeless tobacco: None  . Alcohol Use: 0.0 oz/week    0 Standard drinks or equivalent per week     Comment: occasional use; drinks hard liquor once a week or less    Review of Systems Constitutional: + fever, no chills Eyes: No visual changes. ENT: No sore throat. Cardiovascular: Denies chest pain. Respiratory: + shortness of breath. Gastrointestinal: No abdominal pain.  No nausea, no vomiting.  No diarrhea.  No constipation. Genitourinary: Negative for dysuria. Musculoskeletal: Negative for back pain. Skin: Negative for rash. Neurological: Negative for headaches, focal weakness or numbness.  10-point ROS otherwise  negative.  ____________________________________________   PHYSICAL EXAM:  VITAL SIGNS: ED Triage Vitals  Enc Vitals Group     BP 05/06/15 0958 142/74 mmHg     Pulse Rate 05/06/15 0958 90     Resp 05/06/15 0958 18     Temp 05/06/15 0958 98.8 F (37.1 C)     Temp Source 05/06/15 0958 Oral     SpO2 05/06/15 0958 96 %     Weight 05/06/15 0958 150 lb (68.04 kg)     Height 05/06/15 0958 5\' 6"  (1.676 m)     Head Cir --      Peak Flow --      Pain Score 05/06/15 0959 0     Pain Loc --      Pain Edu? --      Excl. in [place]? --     Constitutional: Alert and oriented. Well appearing and in no acute distress. + frequent dry cough. Eyes: Conjunctivae are normal. PERRL. EOMI. Head: Atraumatic. Nose: No congestion/rhinnorhea. Mouth/Throat: Mucous membranes are moist.  Oropharynx non-erythematous. Neck: No stridor.  Supple without meningismus. Cardiovascular: Normal rate, regular rhythm. Grossly normal heart sounds.  Good peripheral circulation. Respiratory: Normal respiratory effort.  No retractions. No tachypnea or increased work of breathing but rest of her slightly coarse bilaterally with a faint expiratory wheeze in the right upper lung fields. Gastrointestinal: Soft and nontender. No distention. No CVA tenderness. Genitourinary: deferred Musculoskeletal: No lower extremity tenderness nor edema.  No joint effusions. No calf tenderness/swelling/asymmetry. Neurologic:  Normal speech and language. No gross focal neurologic deficits are appreciated. No gait instability. Skin:  Skin is warm, dry and intact. No rash noted. Psychiatric: Mood and affect are normal. Speech and behavior are normal.  ____________________________________________   LABS (all labs ordered are listed, but only abnormal results are displayed)  Labs Reviewed  COMPREHENSIVE METABOLIC PANEL - Abnormal; Notable for the following:    Sodium 131 (*)    Chloride 99 (*)    Total Protein 8.2 (*)    All other components  within normal limits  CBC WITH DIFFERENTIAL/PLATELET - Abnormal; Notable for the following:    Lymphs Abs 0.7 (*)    All other components within normal limits  TROPONIN I   ____________________________________________  EKG  ED ECG REPORT I, Joanne Gavel, the attending physician, personally viewed and interpreted this ECG.   Date: 05/06/2015  EKG Time: 10:09  Rate: 87  Rhythm: normal EKG, normal sinus rhythm  Axis: normal  Intervals:none  ST&T Change: No acute ST elevation.  ____________________________________________  RADIOLOGY  CXR  FINDINGS: Normal heart size, mediastinal contours, and pulmonary vascularity.  Atherosclerotic calcification aorta.  Calcified granuloma LEFT mid stable.  Lungs mildly  hyperinflated but clear.  No pulmonary infiltrate, pleural effusion or pneumothorax.  Diffuse osseous demineralization.  IMPRESSION: No acute abnormalities. ____________________________________________   PROCEDURES  Procedure(s) performed: None  Critical Care performed: No  ____________________________________________   INITIAL IMPRESSION / ASSESSMENT AND PLAN / ED COURSE  Pertinent labs & imaging results that were available during my care of the patient were reviewed by me and considered in my medical decision making (see chart for details).  Josejesus Drews Gilbo is a 75 y.o. male with no chronic medical problems who presents for evaluation of nearly one month of productive cough, improved after he took antibiotics but persistent. On exam, he is generally well-appearing and in no acute distress. Vital signs stable, he is afebrile. He does have slight coarsening of his breath sounds with a faint right-sided wheeze however he is moving air well. No increased work of breathing. EKG normal. Troponin negative. No cardiac history and I doubt this represents ACS. Not consistent with PE or acute aortic dissection. CMP with mild hyponatremia with sodium 131. Normal CBC.  Chest x-ray clear. Suspect bronchitis. We'll give albuterol treatment and reassess.  ----------------------------------------- 12:10 PM on 05/06/2015 ----------------------------------------- Patient reports symptomatic improvement after albuterol nebulizer treatment. He has improved air movement, resolution of wheezing. Chest x-ray shows no acute abnormalities, no pneumonia. I discussed with him that his symptoms may be secondary to bronchitis. We will discharge him with Ladona Ridgel as well as albuterol MDI. I also discussed with him that he needs to follow-up with his primary care doctor in 2 days for reevaluation and he voices understanding of this. We discussed meticulous return precautions, need for close follow-up and he and his wife at bedside are comfortable with the discharge plan. DC home.  ____________________________________________   FINAL CLINICAL IMPRESSION(S) / ED DIAGNOSES  Final diagnoses:  Bronchitis      Joanne Gavel, MD 05/06/15 1214

## 2015-05-06 NOTE — ED Notes (Signed)
Reports sinus congestion x 4 wks.  Reports over the past few days having increased sob, and also had temp yesterday of 100

## 2015-05-08 ENCOUNTER — Ambulatory Visit (INDEPENDENT_AMBULATORY_CARE_PROVIDER_SITE_OTHER): Payer: Medicare Other | Admitting: Family Medicine

## 2015-05-08 ENCOUNTER — Encounter: Payer: Self-pay | Admitting: Family Medicine

## 2015-05-08 VITALS — BP 130/80 | HR 80 | Temp 98.0°F | Resp 16 | Ht 67.5 in | Wt 149.0 lb

## 2015-05-08 DIAGNOSIS — J4520 Mild intermittent asthma, uncomplicated: Secondary | ICD-10-CM

## 2015-05-08 DIAGNOSIS — J40 Bronchitis, not specified as acute or chronic: Secondary | ICD-10-CM | POA: Diagnosis not present

## 2015-05-08 DIAGNOSIS — J45909 Unspecified asthma, uncomplicated: Secondary | ICD-10-CM | POA: Insufficient documentation

## 2015-05-08 MED ORDER — LEVOFLOXACIN 500 MG PO TABS: 500.0000 mg | ORAL_TABLET | Freq: Every day | ORAL | Status: AC

## 2015-05-08 MED ORDER — MONTELUKAST SODIUM 10 MG PO TABS: 10.0000 mg | ORAL_TABLET | Freq: Every day | ORAL | Status: DC

## 2015-05-08 NOTE — Progress Notes (Signed)
Patient: Gregory Arroyo Male    DOB: 1940-02-09   75 y.o.   MRN: [id number] Visit Date: 05/08/2015  Today's Provider: Lelon Huh, MD   Chief Complaint  Patient presents with  . Hospitalization Follow-up   Subjective:    HPI   Follow up ER visit  Patient was seen in ER for [facility] on 05/06/2015. He was treated for Bronchitis. Treatment for this included: albuterol nebulizer treatment.patient was discharged on tessalon perles as well as albuterol mdi. Patient to follow-up with pcp 2 days after discharge. He reports good compliance with treatment. He reports this condition is Improved.  He was treated 04/17/15 for sinusitis with azithromycin and prednisone and states symptoms resolved, but returned within a few days of finishing. He had normal cxr and labs at ER and was given albuterol nebulizer and prescription for albuterol inhaler, which helps a lot, but symptoms return within a few hours. He is still coughing up and blowing out yellow mucous, but no fevers, chills or sweats.  ----------------------------------------------------------------------    Allergies  Allergen Reactions  . Pollen Extract    Previous Medications   ALBUTEROL (PROVENTIL HFA;VENTOLIN HFA) 108 (90 BASE) MCG/ACT INHALER    Inhale 2 puffs into the lungs every 6 (six) hours as needed for wheezing or shortness of breath. Please dispense one adult spacer for use with MDI.   ASPIRIN 81 MG TABLET    Take 1 tablet by mouth daily.   BENZONATATE (TESSALON PERLES) 100 MG CAPSULE    Take 1 capsule (100 mg total) by mouth 3 (three) times daily as needed for cough.   CETIRIZINE HCL (ZYRTEC ALLERGY) 10 MG CAPS    Take 1 tablet by mouth daily.   FLUTICASONE (FLONASE) 50 MCG/ACT NASAL SPRAY    Place 2 sprays into both nostrils daily.   LORATADINE (CLARITIN) 10 MG TABLET    Take 1 tablet by mouth daily as needed.   MULTIPLE VITAMINS-MINERALS (MULTIVITAMIN ADULT PO)    Take 1 tablet by mouth daily.   OMEGA-3 FATTY  ACIDS (FISH OIL PO)    Take 1 capsule by mouth daily.    Review of Systems  Constitutional: Negative for fever, chills and appetite change.  HENT: Positive for congestion. Negative for ear pain, postnasal drip, sinus pressure, sneezing and sore throat.   Respiratory: Positive for cough, shortness of breath and wheezing. Negative for chest tightness.   Cardiovascular: Negative for chest pain and palpitations.  Gastrointestinal: Negative for nausea, vomiting and abdominal pain.    Social History  Substance Use Topics  . Smoking status: Former Smoker -- 1.00 packs/day for 2 years    Types: Cigarettes    Quit date: 02/04/1958  . Smokeless tobacco: Not on file  . Alcohol Use: 0.0 oz/week    0 Standard drinks or equivalent per week     Comment: occasional use; drinks hard liquor once a week or less   Objective:   BP 130/80 mmHg  Pulse 80  Temp(Src) 98 F (36.7 C) (Oral)  Resp 16  Ht 5' 7.5" (1.715 m)  Wt 149 lb (67.586 kg)  BMI 22.98 kg/m2  SpO2 96%  Physical Exam  General Appearance:    Alert, cooperative, no distress  HENT:   bilateral TM normal without fluid or infection, neck without nodes, sinuses nontender and nasal mucosa pale and congested  Eyes:    PERRL, conjunctiva/corneas clear, EOM's intact       Lungs:     Diffuse expiratory  wheeze, no rales,  respirations unlabored  Heart:    Regular rate and rhythm  Neurologic:   Awake, alert, oriented x 3. No apparent focal neurological           defect.           Assessment & Plan:     1. Reactive airway disease, mild intermittent, uncomplicated I think much of this is triggered by allergies. Will add Singulair to antihistamine - montelukast (SINGULAIR) 10 MG tablet; Take 1 tablet (10 mg total) by mouth at bedtime.  Dispense: 30 tablet; Refill: 2  2. Bronchitis  - levofloxacin (LEVAQUIN) 500 MG tablet; Take 1 tablet (500 mg total) by mouth daily.  Dispense: 7 tablet; Refill: 0  Call if symptoms change or if not  rapidly improving.            Lelon Huh, MD  [facility]

## 2015-09-27 ENCOUNTER — Ambulatory Visit (INDEPENDENT_AMBULATORY_CARE_PROVIDER_SITE_OTHER): Payer: Medicare Other | Admitting: Family Medicine

## 2015-09-27 ENCOUNTER — Encounter: Payer: Self-pay | Admitting: Family Medicine

## 2015-09-27 VITALS — BP 140/80 | HR 72 | Temp 98.1°F | Resp 16 | Ht 68.0 in | Wt 151.0 lb

## 2015-09-27 DIAGNOSIS — R42 Dizziness and giddiness: Secondary | ICD-10-CM | POA: Diagnosis not present

## 2015-09-27 NOTE — Progress Notes (Signed)
Patient: Gregory Arroyo Male    DOB: 1940-09-13   75 y.o.   MRN: [id number] Visit Date: 09/27/2015  Today's Provider: Lelon Huh, MD   Chief Complaint  Patient presents with  . Dizziness  . Shortness of Breath   Subjective:    Patient has been having dizziness intermittently since Monday 09/25/2015. Symptoms of nausea and headaches. Dizziness seems to occur with quick movements. Sometimes if he turns his head quickly, but not when lying back. A little bit of spinning sensation at times. No chest pains, palpitations, or shortness of breath.    Dizziness  This is a new problem. The current episode started in the past 7 days (2 days ago). The problem occurs intermittently. The problem has been unchanged. Associated symptoms include a change in bowel habit, fatigue, nausea, urinary symptoms, a visual change and weakness. Pertinent negatives include no arthralgias, chills, congestion, coughing, diaphoresis, joint swelling, myalgias, numbness or vertigo. The symptoms are aggravated by twisting. He has tried nothing for the symptoms.      Allergies  Allergen Reactions  . Pollen Extract    Current Meds  Medication Sig  . albuterol (PROVENTIL HFA;VENTOLIN HFA) 108 (90 Base) MCG/ACT inhaler Inhale 2 puffs into the lungs every 6 (six) hours as needed for wheezing or shortness of breath. Please dispense one adult spacer for use with MDI.  Marland Kitchen aspirin 81 MG tablet Take 1 tablet by mouth daily.  . Cetirizine HCl (ZYRTEC ALLERGY) 10 MG CAPS Take 1 tablet by mouth daily.  . fluticasone (FLONASE) 50 MCG/ACT nasal spray Place 2 sprays into both nostrils daily.  Marland Kitchen loratadine (CLARITIN) 10 MG tablet Take 1 tablet by mouth daily as needed.  . montelukast (SINGULAIR) 10 MG tablet Take 1 tablet (10 mg total) by mouth at bedtime.  . Multiple Vitamins-Minerals (MULTIVITAMIN ADULT PO) Take 1 tablet by mouth daily.  . Omega-3 Fatty Acids (FISH OIL PO) Take 1 capsule by mouth daily.    Review of  Systems  Constitutional: Positive for fatigue. Negative for appetite change, chills and diaphoresis.  HENT: Negative for congestion.   Respiratory: Negative for cough and chest tightness.   Cardiovascular: Negative for palpitations.  Gastrointestinal: Positive for change in bowel habit and nausea.  Musculoskeletal: Negative for arthralgias, joint swelling and myalgias.  Neurological: Positive for dizziness and weakness. Negative for vertigo and numbness.    Social History  Substance Use Topics  . Smoking status: Former Smoker    Packs/day: 1.00    Years: 2.00    Types: Cigarettes    Quit date: 02/04/1958  . Smokeless tobacco: Not on file  . Alcohol use 0.0 oz/week     Comment: occasional use; drinks hard liquor once a week or less   Objective:   BP 140/80 (BP Location: Right Arm, Patient Position: Sitting, Cuff Size: Normal)   Pulse 72   Temp 98.1 F (36.7 C) (Oral)   Resp 16   Ht 5\' 8"  (1.727 m)   Wt 151 lb (68.5 kg)   SpO2 96%   BMI 22.96 kg/m   Physical Exam   General Appearance:    Alert, cooperative, no distress  Eyes:    PERRL, conjunctiva/corneas clear, EOM's intact       Lungs:     Clear to auscultation bilaterally, respirations unlabored  Heart:    Regular rate and rhythm, no murmurs  Neurologic:   Awake, alert, oriented x 3. No apparent focal neurological  Defect. Negative  Dix-Hallpike test          Assessment & Plan:     1. Dizziness No clear etiology, not suggestive of BPV. Will check labs. He will go to [facility] to get results same day.  - EKG 12-Lead - CBC - Comprehensive metabolic panel - Troponin I     The entirety of the information documented in the History of Present Illness, Review of Systems and Physical Exam were personally obtained by me. Portions of this information were initially documented by April M. Sabra Heck, CMA and reviewed by me for thoroughness and accuracy.    Lelon Huh, MD  [facility]

## 2015-09-28 ENCOUNTER — Telehealth: Payer: Self-pay | Admitting: *Deleted

## 2015-09-28 ENCOUNTER — Other Ambulatory Visit
Admission: RE | Admit: 2015-09-28 | Discharge: 2015-09-28 | Disposition: A | Discharge status: Home / Self Care | Payer: Medicare Other | Source: Ambulatory Visit | Attending: Family Medicine | Admitting: Family Medicine

## 2015-09-28 DIAGNOSIS — R42 Dizziness and giddiness: Secondary | ICD-10-CM | POA: Insufficient documentation

## 2015-09-28 LAB — COMPREHENSIVE METABOLIC PANEL
ALT: 48 U/L (ref 17–63)
ANION GAP: 6 (ref 5–15)
AST: 38 U/L (ref 15–41)
Albumin: 3.9 g/dL (ref 3.5–5.0)
Alkaline Phosphatase: 51 U/L (ref 38–126)
BUN: 18 mg/dL (ref 6–20)
CALCIUM: 9.4 mg/dL (ref 8.9–10.3)
CHLORIDE: 105 mmol/L (ref 101–111)
CO2: 29 mmol/L (ref 22–32)
CREATININE: 0.84 mg/dL (ref 0.61–1.24)
Glucose, Bld: 94 mg/dL (ref 65–99)
Potassium: 4.1 mmol/L (ref 3.5–5.1)
SODIUM: 140 mmol/L (ref 135–145)
Total Bilirubin: 0.6 mg/dL (ref 0.3–1.2)
Total Protein: 7.7 g/dL (ref 6.5–8.1)

## 2015-09-28 LAB — CBC
HCT: 44.5 % (ref 40.0–52.0)
HEMOGLOBIN: 15.6 g/dL (ref 13.0–18.0)
MCH: 32.8 pg (ref 26.0–34.0)
MCHC: 35 g/dL (ref 32.0–36.0)
MCV: 93.7 fL (ref 80.0–100.0)
PLATELETS: 190 10*3/uL (ref 150–440)
RBC: 4.75 MIL/uL (ref 4.40–5.90)
RDW: 13.5 % (ref 11.5–14.5)
WBC: 6.3 10*3/uL (ref 3.8–10.6)

## 2015-09-28 LAB — TROPONIN I: Troponin I: <0.03 ng/mL (ref <0.03)

## 2015-09-28 MED ORDER — MECLIZINE HCL 25 MG PO TABS: 25.0000 mg | ORAL_TABLET | Freq: Four times a day (QID) | ORAL | 0 refills | Status: DC | PRN

## 2015-09-28 NOTE — Telephone Encounter (Signed)
-----   Message from Birdie Sons, MD sent at 09/28/2015  1:43 PM EDT ----- Please advise patient labs are all completely normal. No sign of stress or strain on heart and normal blood sugar and blood counts. If dizziness is not better then would recommend meclizine 25mg  one every six hours as needed, #20, no refills. Call if not resolved over the weekend.

## 2015-10-03 NOTE — Telephone Encounter (Signed)
Patient was notified of results. Expressed understanding. Rx sent to pharmacy. 

## 2015-10-20 ENCOUNTER — Ambulatory Visit (INDEPENDENT_AMBULATORY_CARE_PROVIDER_SITE_OTHER): Payer: Medicare Other | Admitting: Family Medicine

## 2015-10-20 ENCOUNTER — Encounter: Payer: Self-pay | Admitting: Family Medicine

## 2015-10-20 VITALS — BP 124/60 | HR 66 | Temp 97.5°F | Resp 16 | Wt 151.0 lb

## 2015-10-20 DIAGNOSIS — M7581 Other shoulder lesions, right shoulder: Secondary | ICD-10-CM | POA: Diagnosis not present

## 2015-10-20 DIAGNOSIS — M778 Other enthesopathies, not elsewhere classified: Secondary | ICD-10-CM

## 2015-10-20 MED ORDER — MELOXICAM 15 MG PO TABS: 15.0000 mg | ORAL_TABLET | Freq: Every day | ORAL | 0 refills | Status: DC

## 2015-10-20 NOTE — Patient Instructions (Signed)
Continue warm compresses for 20 minutes 3-4 x day to right shoulder.

## 2015-10-20 NOTE — Progress Notes (Signed)
Subjective:     Patient ID: Gregory Arroyo, male   DOB: 1940-12-31, 75 y.o.   MRN: [id number]  HPI  Chief Complaint  Patient presents with  . Shoulder Pain    right. for 5 days. He was lifting heavy objects. pain from shoulder to elbow  States he was helping to move heavy furniture prior to the onset of his symptoms. Has taken Advil on one occasion. Reports prior left rotator cuff surgery.   Review of Systems     Objective:   Physical Exam  Constitutional: He appears well-developed and well-nourished. No distress.  Musculoskeletal:  Right grip/EF/EE/shoulder 5/5. Can range greater than 90 degrees without discomfort. No elbow condyle tenderness. Localizes pain to his medial deltoid area.       Assessment:    1. Deltoid tendonitis of right shoulder - meloxicam (MOBIC) 15 MG tablet; Take 1 tablet (15 mg total) by mouth daily. With food.  Dispense: 14 tablet; Refill: 0    Plan:    Discussed use of warm compresses.

## 2015-10-26 ENCOUNTER — Ambulatory Visit
Admission: RE | Admit: 2015-10-26 | Discharge: 2015-10-26 | Disposition: A | Discharge status: Home / Self Care | Payer: Medicare Other | Source: Ambulatory Visit | Attending: Family Medicine | Admitting: Family Medicine

## 2015-10-26 ENCOUNTER — Ambulatory Visit (INDEPENDENT_AMBULATORY_CARE_PROVIDER_SITE_OTHER): Payer: Medicare Other | Admitting: Family Medicine

## 2015-10-26 ENCOUNTER — Encounter: Payer: Self-pay | Admitting: Family Medicine

## 2015-10-26 VITALS — BP 110/60 | HR 67 | Temp 98.0°F | Resp 16 | Ht 68.0 in | Wt 152.0 lb

## 2015-10-26 DIAGNOSIS — M778 Other enthesopathies, not elsewhere classified: Secondary | ICD-10-CM

## 2015-10-26 DIAGNOSIS — M7581 Other shoulder lesions, right shoulder: Principal | ICD-10-CM

## 2015-10-26 MED ORDER — MELOXICAM 15 MG PO TABS: 15.0000 mg | ORAL_TABLET | Freq: Every day | ORAL | 0 refills | Status: DC

## 2015-10-26 MED ORDER — HYDROCODONE-ACETAMINOPHEN 5-325 MG PO TABS: ORAL_TABLET | ORAL | 0 refills | Status: DC

## 2015-10-26 NOTE — Progress Notes (Signed)
Subjective:     Patient ID: Gregory Arroyo, male   DOB: [birth year], 75 y.o.   MRN: [id number]  HPI  Chief Complaint  Patient presents with  . Follow-up  . Shoulder Pain  Here in f/u of o.v. Of 9/15 for deltoid strain/tendonitis. States it is not improving with meloxicam and he has difficulty sleeping at night. Wishes an injection.   Review of Systems     Objective:   Physical Exam  Constitutional: He appears well-developed and well-nourished. No distress.  Musculoskeletal:  Continues to localize pain to his right deltoid area which is exacerbated when he puts his shoulder in extension/internal rotation.       Assessment:    1. Deltoid tendonitis of right shoulder: - HYDROcodone-acetaminophen (NORCO/VICODIN) 5-325 MG tablet; One every 4-6 hours as needed for pain  Dispense: 12 tablet; Refill: 0 - Ambulatory referral to Orthopedic Surgery - meloxicam (MOBIC) 15 MG tablet; Take 1 tablet (15 mg total) by mouth daily. With food.  Dispense: 14 tablet; Refill: 0 - DG Shoulder Right; Future    Plan:    Further f/u pending x-ray report.

## 2015-10-26 NOTE — Patient Instructions (Signed)
We will call you with the referral. Use the pain medication to help sleep at night.

## 2015-10-27 ENCOUNTER — Telehealth: Payer: Self-pay

## 2015-10-27 NOTE — Telephone Encounter (Signed)
[id number] 

## 2015-10-27 NOTE — Telephone Encounter (Signed)
-----   Message from Carmon Ginsberg, [place] sent at 10/26/2015  4:35 PM EDT ----- Arthritic changes without fracture noted. Orthopedic evaluation pending

## 2015-10-31 NOTE — Telephone Encounter (Signed)
Patient was advised he states that he has Ortho appt set for 11/07/15 at [facility]. KW

## 2015-12-14 ENCOUNTER — Ambulatory Visit: Payer: Medicare Other

## 2015-12-21 ENCOUNTER — Ambulatory Visit (INDEPENDENT_AMBULATORY_CARE_PROVIDER_SITE_OTHER): Payer: Medicare Other

## 2015-12-21 DIAGNOSIS — Z23 Encounter for immunization: Secondary | ICD-10-CM | POA: Diagnosis not present

## 2015-12-25 ENCOUNTER — Other Ambulatory Visit: Payer: Self-pay | Admitting: Orthopedic Surgery

## 2015-12-25 DIAGNOSIS — M25511 Pain in right shoulder: Secondary | ICD-10-CM

## 2016-01-10 ENCOUNTER — Ambulatory Visit
Admission: RE | Admit: 2016-01-10 | Discharge: 2016-01-10 | Disposition: A | Discharge status: Home / Self Care | Payer: Medicare Other | Source: Ambulatory Visit | Attending: Orthopedic Surgery | Admitting: Orthopedic Surgery

## 2016-01-10 DIAGNOSIS — X58XXXA Exposure to other specified factors, initial encounter: Secondary | ICD-10-CM | POA: Diagnosis not present

## 2016-01-10 DIAGNOSIS — M7581 Other shoulder lesions, right shoulder: Secondary | ICD-10-CM | POA: Insufficient documentation

## 2016-01-10 DIAGNOSIS — S4991XA Unspecified injury of right shoulder and upper arm, initial encounter: Secondary | ICD-10-CM | POA: Diagnosis present

## 2016-01-10 DIAGNOSIS — S46011A Strain of muscle(s) and tendon(s) of the rotator cuff of right shoulder, initial encounter: Secondary | ICD-10-CM | POA: Insufficient documentation

## 2016-01-10 DIAGNOSIS — M25511 Pain in right shoulder: Secondary | ICD-10-CM

## 2016-03-28 ENCOUNTER — Ambulatory Visit (INDEPENDENT_AMBULATORY_CARE_PROVIDER_SITE_OTHER): Payer: Medicare HMO | Admitting: Physician Assistant

## 2016-03-28 ENCOUNTER — Encounter: Payer: Self-pay | Admitting: Physician Assistant

## 2016-03-28 VITALS — BP 110/76 | HR 76 | Temp 98.0°F | Resp 16 | Wt 155.0 lb

## 2016-03-28 DIAGNOSIS — H6122 Impacted cerumen, left ear: Secondary | ICD-10-CM | POA: Diagnosis not present

## 2016-03-28 DIAGNOSIS — M542 Cervicalgia: Secondary | ICD-10-CM

## 2016-03-28 NOTE — Patient Instructions (Signed)
Lymphadenopathy Introduction Lymphadenopathy refers to swollen or enlarged lymph glands, also called lymph nodes. Lymph glands are part of your body's defense (immune) system, which protects the body from infections, germs, and diseases. Lymph glands are found in many locations in your body, including the neck, underarm, and groin. Many things can cause lymph glands to become enlarged. When your immune system responds to germs, such as viruses or bacteria, infection-fighting cells and fluid build up. This causes the glands to grow in size. Usually, this is not something to worry about. The swelling and any soreness often go away without treatment. However, swollen lymph glands can also be caused by a number of diseases. Your health care provider may do various tests to help determine the cause. If the cause of your swollen lymph glands cannot be found, it is important to monitor your condition to make sure the swelling goes away. Follow these instructions at home: Watch your condition for any changes. The following actions may help to lessen any discomfort you are feeling:  Get plenty of rest.  Take medicines only as directed by your health care provider. Your health care provider may recommend over-the-counter medicines for pain.  Apply moist heat compresses to the site of swollen lymph nodes as directed by your health care provider. This can help reduce any pain.  Check your lymph nodes daily for any changes.  Keep all follow-up visits as directed by your health care provider. This is important. Contact a health care provider if:  Your lymph nodes are still swollen after 2 weeks.  Your swelling increases or spreads to other areas.  Your lymph nodes are hard, seem fixed to the skin, or are growing rapidly.  Your skin over the lymph nodes is red and inflamed.  You have a fever.  You have chills.  You have fatigue.  You develop a sore throat.  You have abdominal pain.  You have  weight loss.  You have night sweats. Get help right away if:  You notice fluid leaking from the area of the enlarged lymph node.  You have severe pain in any area of your body.  You have chest pain.  You have shortness of breath. This information is not intended to replace advice given to you by your health care provider. Make sure you discuss any questions you have with your health care provider. Document Released: 10/31/2007 Document Revised: 06/29/2015 Document Reviewed: 08/26/2013  2017 [employer]vier  

## 2016-03-28 NOTE — Progress Notes (Signed)
Patient: Gregory Arroyo Male    DOB: 1940/04/05   76 y.o.   MRN: [id number] Visit Date: 03/28/2016  Today's Provider: Trinna Post, PA-C   Chief Complaint  Patient presents with  . Lymphadenopathy    Left side of neck; started yesterday.   Subjective:    HPI Pt is a 76 y/o old man with history of sinusitis and use of hearing aids who comes in today complaining for a swollen lymph node located on the left side of his neck.  He first noticed it yesterday and since then it has become more painful to touch.  He is not sure if it larger.  He denies fevers, chills, nausea, and diarrhea. No weight loss, night sweats.  He does report feeling a little more fatigued than usual.    Allergies  Allergen Reactions  . Pollen Extract      Current Outpatient Prescriptions:  .  albuterol (PROVENTIL HFA;VENTOLIN HFA) 108 (90 Base) MCG/ACT inhaler, Inhale 2 puffs into the lungs every 6 (six) hours as needed for wheezing or shortness of breath. Please dispense one adult spacer for use with MDI., Disp: 1 Inhaler, Rfl: 0 .  aspirin 81 MG tablet, Take 1 tablet by mouth daily., Disp: , Rfl:  .  Cetirizine HCl (ZYRTEC ALLERGY) 10 MG CAPS, Take 1 tablet by mouth daily., Disp: , Rfl:  .  fluticasone (FLONASE) 50 MCG/ACT nasal spray, Place 2 sprays into both nostrils daily., Disp: 16 g, Rfl: 5 .  HYDROcodone-acetaminophen (NORCO/VICODIN) 5-325 MG tablet, One every 4-6 hours as needed for pain, Disp: 12 tablet, Rfl: 0 .  loratadine (CLARITIN) 10 MG tablet, Take 1 tablet by mouth daily as needed., Disp: , Rfl:  .  meclizine (ANTIVERT) 25 MG tablet, Take 1 tablet (25 mg total) by mouth every 6 (six) hours as needed for dizziness., Disp: 30 tablet, Rfl: 0 .  meloxicam (MOBIC) 15 MG tablet, Take 1 tablet (15 mg total) by mouth daily. With food., Disp: 14 tablet, Rfl: 0 .  montelukast (SINGULAIR) 10 MG tablet, Take 1 tablet (10 mg total) by mouth at bedtime., Disp: 30 tablet, Rfl: 2 .  Multiple  Vitamins-Minerals (MULTIVITAMIN ADULT PO), Take 1 tablet by mouth daily., Disp: , Rfl:  .  Omega-3 Fatty Acids (FISH OIL PO), Take 1 capsule by mouth daily., Disp: , Rfl:   Review of Systems  Constitutional: Positive for fatigue. Negative for activity change, appetite change, chills, diaphoresis, fever and unexpected weight change.  HENT: Negative for congestion, ear discharge, ear pain, nosebleeds, postnasal drip, rhinorrhea, sinus pain, sinus pressure, sneezing, sore throat, tinnitus and trouble swallowing.   Eyes: Negative.   Respiratory: Negative.   Musculoskeletal: Positive for neck pain (From the swollen lympnode.). Negative for arthralgias, back pain, gait problem, joint swelling, myalgias and neck stiffness.    Social History  Substance Use Topics  . Smoking status: Former Smoker    Packs/day: 1.00    Years: 2.00    Types: Cigarettes    Quit date: 02/04/1958  . Smokeless tobacco: Never Used  . Alcohol use 0.0 oz/week     Comment: occasional use; drinks hard liquor once a week or less   Objective:   BP 110/76 (BP Location: Left Arm, Patient Position: Sitting, Cuff Size: Normal)   Pulse 76   Temp 98 F (36.7 C) (Oral)   Resp 16   Wt 155 lb (70.3 kg)   BMI 23.57 kg/m   Physical Exam  Constitutional:  He appears well-developed and well-nourished.  HENT:  Head: Normocephalic and atraumatic.  Right Ear: Tympanic membrane and external ear normal.  Left Ear: External ear normal.  Mouth/Throat: Oropharynx is clear and moist. No oropharyngeal exudate.  Left ear w/ cerumen impaction  Eyes: Conjunctivae are normal.  Neck:  Some slight tenderness to palpation along left scm.  Cardiovascular: Normal rate.   Pulmonary/Chest: Effort normal.  Neurological: He is alert.  Skin: Skin is warm and dry. No rash noted. No erythema.        Assessment & Plan:     1. Impacted cerumen of left ear  Lavaged in office with resolution of impaction  2. Tenderness of neck  I don't  appreciate cervical, preauricular, occipatal adenopathy today, though patient is tender along left side of neck. Advised use of NSAIDs to reduce swelling, salt water gargles if throat is sore. If swelling/pain persists > 1 week, patient to call back. If it develops into sinus infection after >1 week, patient may call back for abx.  Return if symptoms worsen or fail to improve.  The entirety of the information documented in the History of Present Illness, Review of Systems and Physical Exam were personally obtained by me. Portions of this information were initially documented by Ashley Royalty, CMA and reviewed by me for thoroughness and accuracy.          Trinna Post, PA-C  [facility]

## 2016-04-03 DIAGNOSIS — H16223 Keratoconjunctivitis sicca, not specified as Sjogren's, bilateral: Secondary | ICD-10-CM | POA: Diagnosis not present

## 2016-05-28 DIAGNOSIS — R69 Illness, unspecified: Secondary | ICD-10-CM | POA: Diagnosis not present

## 2016-05-29 DIAGNOSIS — H16223 Keratoconjunctivitis sicca, not specified as Sjogren's, bilateral: Secondary | ICD-10-CM | POA: Diagnosis not present

## 2016-06-06 DIAGNOSIS — R69 Illness, unspecified: Secondary | ICD-10-CM | POA: Diagnosis not present

## 2016-06-19 DIAGNOSIS — Z7982 Long term (current) use of aspirin: Secondary | ICD-10-CM | POA: Diagnosis not present

## 2016-06-19 DIAGNOSIS — K08409 Partial loss of teeth, unspecified cause, unspecified class: Secondary | ICD-10-CM | POA: Diagnosis not present

## 2016-06-19 DIAGNOSIS — H9113 Presbycusis, bilateral: Secondary | ICD-10-CM | POA: Diagnosis not present

## 2016-06-19 DIAGNOSIS — R3915 Urgency of urination: Secondary | ICD-10-CM | POA: Diagnosis not present

## 2016-06-19 DIAGNOSIS — M130 Polyarthritis, unspecified: Secondary | ICD-10-CM | POA: Diagnosis not present

## 2016-06-19 DIAGNOSIS — Z79899 Other long term (current) drug therapy: Secondary | ICD-10-CM | POA: Diagnosis not present

## 2016-06-19 DIAGNOSIS — Z6824 Body mass index (BMI) 24.0-24.9, adult: Secondary | ICD-10-CM | POA: Diagnosis not present

## 2016-06-19 DIAGNOSIS — Z0000 Encounter for general adult medical examination without abnormal findings: Secondary | ICD-10-CM | POA: Diagnosis not present

## 2016-06-19 DIAGNOSIS — R233 Spontaneous ecchymoses: Secondary | ICD-10-CM | POA: Diagnosis not present

## 2016-06-19 DIAGNOSIS — J302 Other seasonal allergic rhinitis: Secondary | ICD-10-CM | POA: Diagnosis not present

## 2016-07-26 IMAGING — CR DG CHEST 2V
1 series · 2 of 2 positions shown · non-contrast
Comparison: 05/16/2003

CLINICAL DATA: Cough, fever off for 2 weeks, no improvement with
antibiotics

EXAM:
CHEST  2 VIEW

[Series 1: dg chest 2 view · 0.14mm/px · 2 of 2 slices shown]
[im 1/2]
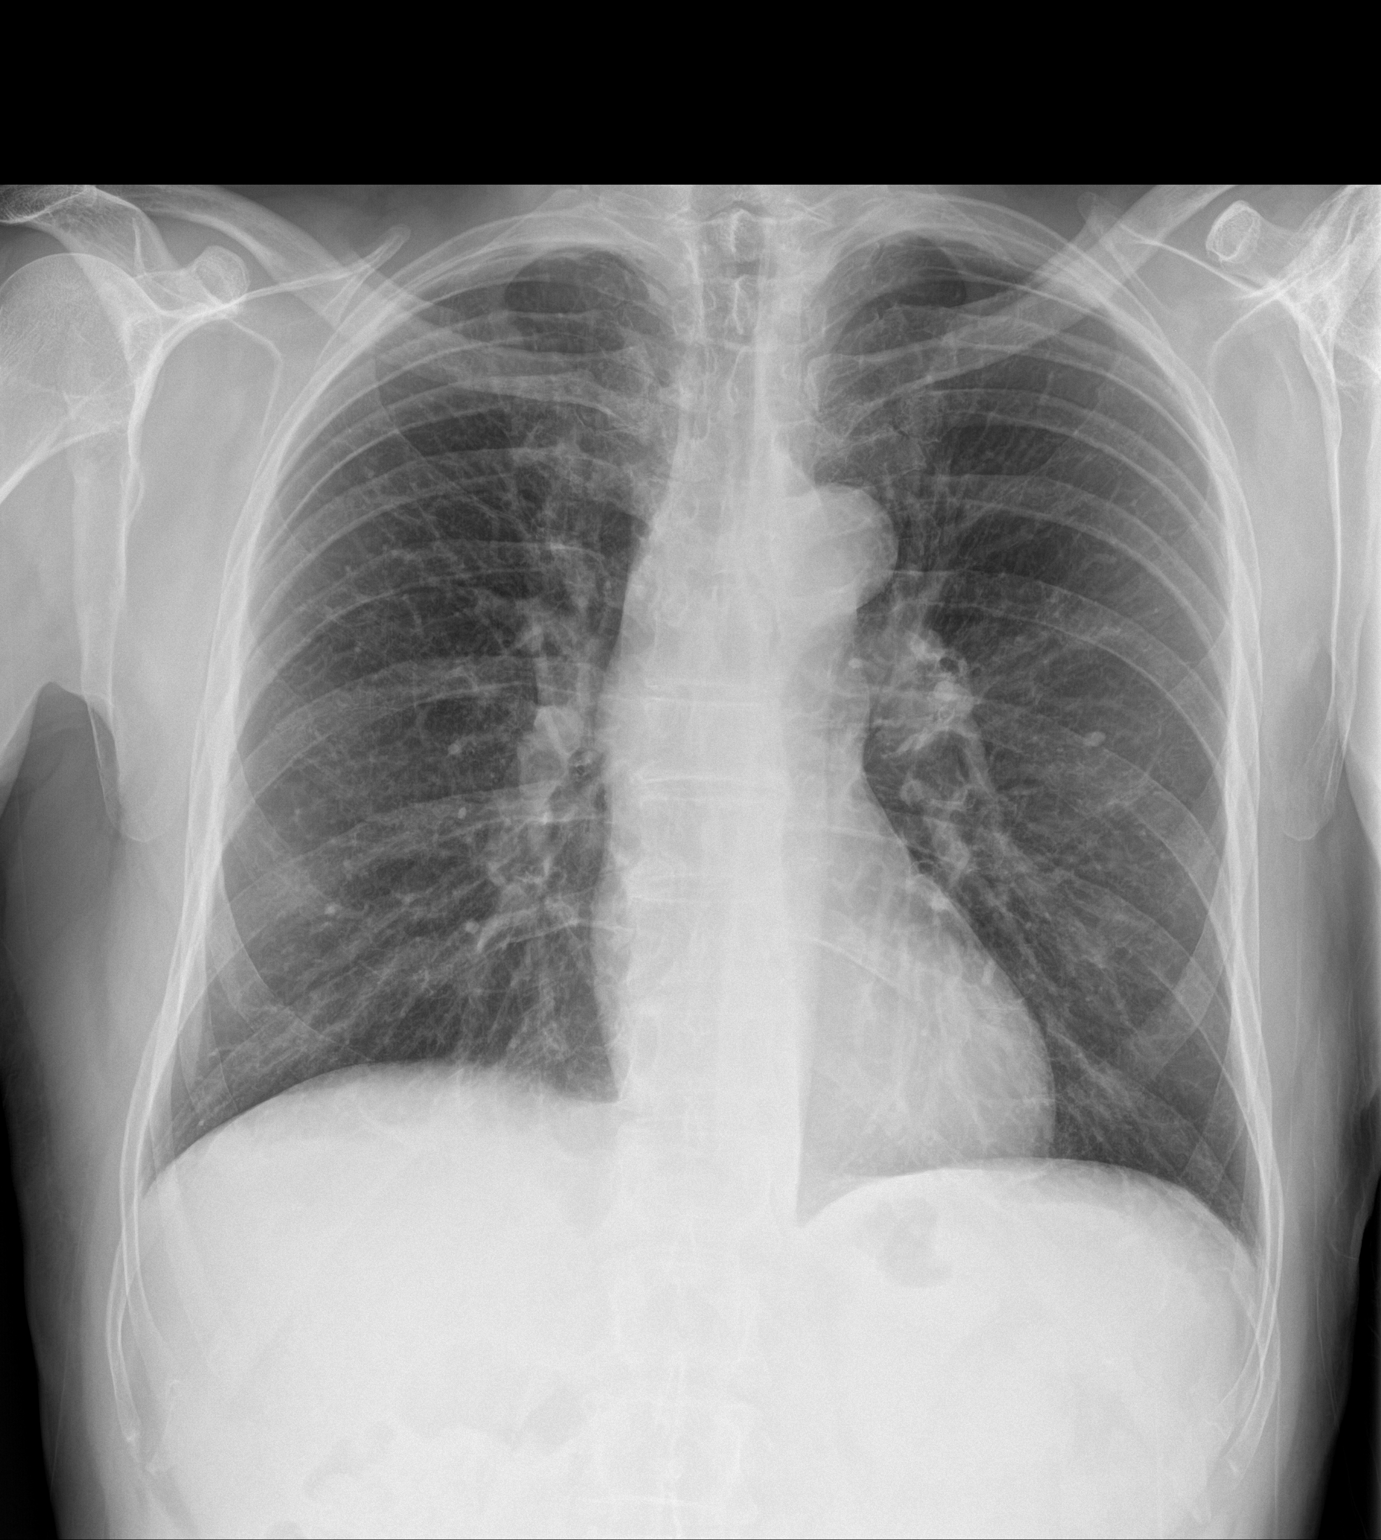
[im 2/2]
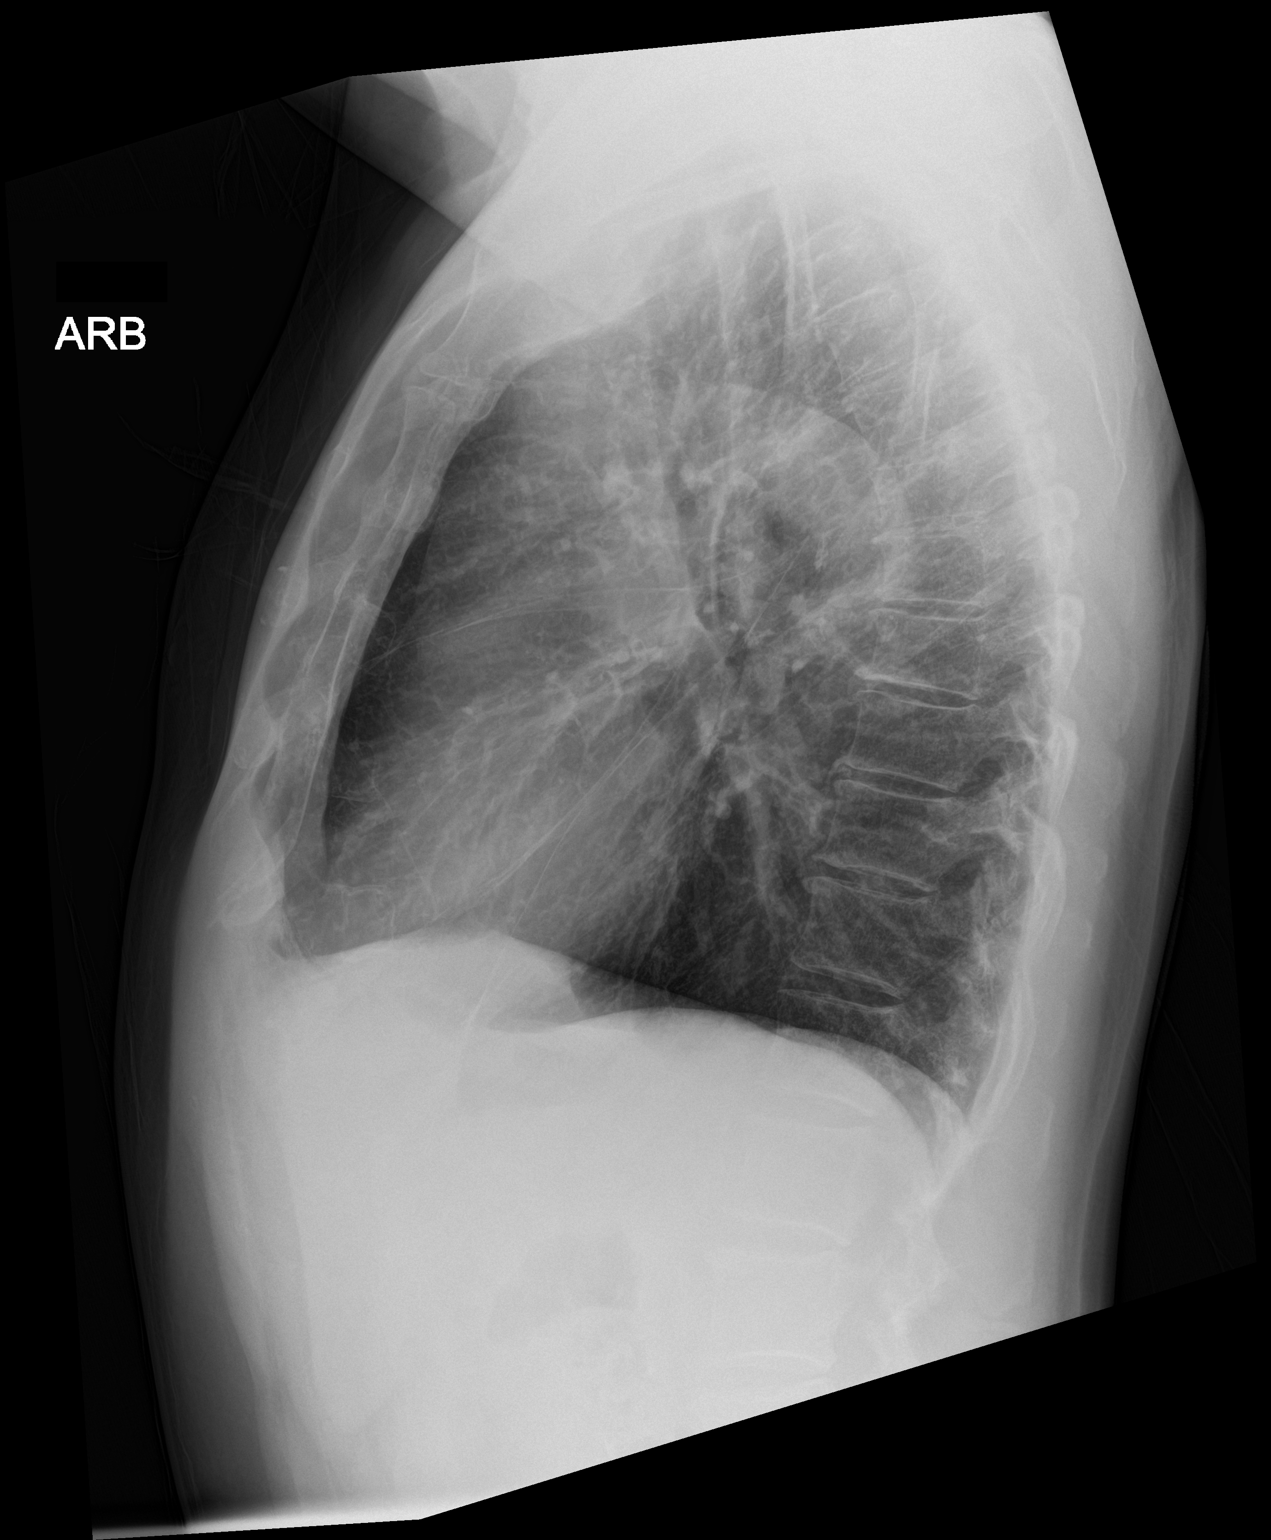

[2 of 2 positions shown; findings below may reference images not displayed]

FINDINGS: Normal heart size, mediastinal contours, and pulmonary vascularity.

Atherosclerotic calcification aorta.

Calcified granuloma LEFT mid stable.

Lungs mildly hyperinflated but clear.

No pulmonary infiltrate, pleural effusion or pneumothorax.

Diffuse osseous demineralization.
IMPRESSION: No acute abnormalities.

## 2016-09-30 ENCOUNTER — Ambulatory Visit (INDEPENDENT_AMBULATORY_CARE_PROVIDER_SITE_OTHER): Payer: Medicare HMO | Admitting: Family Medicine

## 2016-09-30 ENCOUNTER — Encounter: Payer: Self-pay | Admitting: Family Medicine

## 2016-09-30 VITALS — BP 126/72 | HR 64 | Temp 97.5°F | Resp 16 | Wt 153.0 lb

## 2016-09-30 DIAGNOSIS — Z23 Encounter for immunization: Secondary | ICD-10-CM

## 2016-09-30 DIAGNOSIS — H6122 Impacted cerumen, left ear: Secondary | ICD-10-CM

## 2016-09-30 NOTE — Progress Notes (Signed)
Patient: Gregory Arroyo Male    DOB: 26-Jun-1940   76 y.o.   MRN: [id number] Visit Date: 09/30/2016  Today's Provider: Lelon Huh, MD   Chief Complaint  Patient presents with  . Ear Fullness   Subjective:    HPI Patient comes in today c/o right ear fullness. He reports that he had his hearing checked last week, and they told him that his right ear is impacted with ear wax. He is requesting that we flush his ear out to see if this improves his hearing. He denies any pain or tenderness.     Allergies  Allergen Reactions  . Pollen Extract      Current Outpatient Prescriptions:  .  albuterol (PROVENTIL HFA;VENTOLIN HFA) 108 (90 Base) MCG/ACT inhaler, Inhale 2 puffs into the lungs every 6 (six) hours as needed for wheezing or shortness of breath. Please dispense one adult spacer for use with MDI., Disp: 1 Inhaler, Rfl: 0 .  aspirin 81 MG tablet, Take 1 tablet by mouth daily., Disp: , Rfl:  .  Cetirizine HCl (ZYRTEC ALLERGY) 10 MG CAPS, Take 1 tablet by mouth daily., Disp: , Rfl:  .  fluticasone (FLONASE) 50 MCG/ACT nasal spray, Place 2 sprays into both nostrils daily., Disp: 16 g, Rfl: 5 .  HYDROcodone-acetaminophen (NORCO/VICODIN) 5-325 MG tablet, One every 4-6 hours as needed for pain, Disp: 12 tablet, Rfl: 0 .  loratadine (CLARITIN) 10 MG tablet, Take 1 tablet by mouth daily as needed., Disp: , Rfl:  .  meclizine (ANTIVERT) 25 MG tablet, Take 1 tablet (25 mg total) by mouth every 6 (six) hours as needed for dizziness., Disp: 30 tablet, Rfl: 0 .  meloxicam (MOBIC) 15 MG tablet, Take 1 tablet (15 mg total) by mouth daily. With food., Disp: 14 tablet, Rfl: 0 .  montelukast (SINGULAIR) 10 MG tablet, Take 1 tablet (10 mg total) by mouth at bedtime., Disp: 30 tablet, Rfl: 2 .  Multiple Vitamins-Minerals (MULTIVITAMIN ADULT PO), Take 1 tablet by mouth daily., Disp: , Rfl:  .  Omega-3 Fatty Acids (FISH OIL PO), Take 1 capsule by mouth daily., Disp: , Rfl:   Review of Systems    Constitutional: Negative.   HENT: Positive for hearing loss. Negative for ear discharge, ear pain, sinus pain, sinus pressure and sore throat.   Respiratory: Negative.     Social History  Substance Use Topics  . Smoking status: Former Smoker    Packs/day: 1.00    Years: 2.00    Types: Cigarettes    Quit date: 02/04/1958  . Smokeless tobacco: Never Used  . Alcohol use 0.0 oz/week     Comment: occasional use; drinks hard liquor once a week or less   Objective:   BP 126/72 (BP Location: Right Arm, Patient Position: Sitting, Cuff Size: Normal)   Pulse 64   Temp (!) 97.5 F (36.4 C)   Resp 16   Wt 153 lb (69.4 kg)   BMI 23.26 kg/m  Vitals:   09/30/16 0944  BP: 126/72  Pulse: 64  Resp: 16  Temp: (!) 97.5 F (36.4 C)  Weight: 153 lb (69.4 kg)     Physical Exam  General Appearance:    Alert, cooperative, no distress  HENT:   Right TM obstructed by cerumen. Left TM and ear canal clear.   Eyes:    PERRL, conjunctiva/corneas clear, EOM's intact               Assessment & Plan:  1. Impacted cerumen of left ear After soaking with Debrox, ear canal was irrigated with water until clear. Patient tolerated procedure well.    2. Influenza vaccine needed  - Flu vaccine HIGH DOSE PF (Fluzone High dose)       Lelon Huh, MD  [facility]

## 2016-10-29 ENCOUNTER — Ambulatory Visit (INDEPENDENT_AMBULATORY_CARE_PROVIDER_SITE_OTHER): Payer: Medicare HMO

## 2016-10-29 ENCOUNTER — Ambulatory Visit (INDEPENDENT_AMBULATORY_CARE_PROVIDER_SITE_OTHER): Payer: Medicare HMO | Admitting: Family Medicine

## 2016-10-29 VITALS — BP 158/80 | HR 80 | Temp 97.5°F | Ht 68.0 in | Wt 154.4 lb

## 2016-10-29 VITALS — BP 118/62

## 2016-10-29 DIAGNOSIS — R7303 Prediabetes: Secondary | ICD-10-CM

## 2016-10-29 DIAGNOSIS — Z0000 Encounter for general adult medical examination without abnormal findings: Secondary | ICD-10-CM | POA: Diagnosis not present

## 2016-10-29 DIAGNOSIS — Z125 Encounter for screening for malignant neoplasm of prostate: Secondary | ICD-10-CM

## 2016-10-29 NOTE — Progress Notes (Signed)
Subjective:   Gregory Arroyo is a 76 y.o. male who presents for Medicare Annual/Subsequent preventive examination.  Review of Systems:  N/A  Cardiac Risk Factors include: advanced age (>34men, >55 women);male gender     Objective:    Vitals: BP (!) 158/80 (BP Location: Left Arm)   Pulse 80   Temp (!) 97.5 F (36.4 C) (Oral)   Ht 5\' 8"  (1.727 m)   Wt 154 lb 6.4 oz (70 kg)   BMI 23.48 kg/m   Body mass index is 23.48 kg/m.  Tobacco History  Smoking Status  . Former Smoker  . Packs/day: 1.00  . Years: 2.00  . Types: Cigarettes  . Quit date: 02/04/1958  Smokeless Tobacco  . Never Used     Counseling given: Not Answered   Past Medical History:  Diagnosis Date  . History of chicken pox   . History of measles    Past Surgical History:  Procedure Laterality Date  . CATARACT EXTRACTION Right 04/2011   Dr. Martie Round, [place]  . HERNIA REPAIR Right 2010   Dr. Bary Castilla; Inguinal Hernia  . [place]  2001  . SHOULDER SURGERY Left 2009   Dr. Sabra Heck   Family History  Problem Relation Age of Onset  . Stroke Mother   . Heart disease Mother   . Transient ischemic attack Mother   . Heart attack Father   . Heart disease Father   . Colon cancer Sister 62   History  Sexual Activity  . Sexual activity: Not on file    Outpatient Encounter Prescriptions as of 10/29/2016  Medication Sig  . albuterol (PROVENTIL HFA;VENTOLIN HFA) 108 (90 Base) MCG/ACT inhaler Inhale 2 puffs into the lungs every 6 (six) hours as needed for wheezing or shortness of breath. Please dispense one adult spacer for use with MDI.  Marland Kitchen aspirin 81 MG tablet Take 1 tablet by mouth daily.  . Cetirizine HCl (ZYRTEC ALLERGY) 10 MG CAPS Take 1 tablet by mouth daily.   . fluticasone (FLONASE) 50 MCG/ACT nasal spray Place 2 sprays into both nostrils daily. (Patient taking differently: Place 2 sprays into both nostrils daily. )  . loratadine (CLARITIN) 10 MG tablet Take  1 tablet by mouth daily as needed.  . Omega-3 Fatty Acids (FISH OIL PO) Take 1 capsule by mouth daily.  . [DISCONTINUED] HYDROcodone-acetaminophen (NORCO/VICODIN) 5-325 MG tablet One every 4-6 hours as needed for pain  . [DISCONTINUED] meclizine (ANTIVERT) 25 MG tablet Take 1 tablet (25 mg total) by mouth every 6 (six) hours as needed for dizziness.  . [DISCONTINUED] meloxicam (MOBIC) 15 MG tablet Take 1 tablet (15 mg total) by mouth daily. With food. (Patient not taking: Reported on 09/30/2016)  . [DISCONTINUED] montelukast (SINGULAIR) 10 MG tablet Take 1 tablet (10 mg total) by mouth at bedtime.  . [DISCONTINUED] Multiple Vitamins-Minerals (MULTIVITAMIN ADULT PO) Take 1 tablet by mouth daily.   No facility-administered encounter medications on file as of 10/29/2016.     Activities of Daily Living In your present state of health, do you have any difficulty performing the following activities: 10/29/2016 09/30/2016  Hearing? Y Y  Comment bilateral hearing aids -  Vision? N Y  Difficulty concentrating or making decisions? N N  Walking or climbing stairs? N N  Dressing or bathing? N N  Doing errands, shopping? N N  Preparing Food and eating ? N -  Using the Toilet? N -  In the past six months,  have you accidently leaked urine? N -  Do you have problems with loss of bowel control? N -  Managing your Medications? N -  Managing your Finances? N -  Housekeeping or managing your Housekeeping? N -  Some recent data might be hidden    Patient Care Team: Birdie Sons, MD as PCP - General (Family Medicine) Melissa Noon, [place] as Referring Physician (Optometry)   Assessment:     Exercise Activities and Dietary recommendations Current Exercise Habits: The patient does not participate in regular exercise at present [place]), Exercise limited by: None identified  Goals    . Increase water intake          Recommend increasing water intake to 4-6 glasses a day.      Fall  Risk Fall Risk  10/29/2016 09/30/2016 09/30/2014 09/30/2014  Falls in the past year? No No No No   Depression Screen PHQ 2/9 Scores 10/29/2016 09/30/2016 09/30/2014 09/30/2014  PHQ - 2 Score 0 0 0 0  PHQ- 9 Score - - 0 -    Cognitive Function- Pt declined screening today.        Immunization History  Administered Date(s) Administered  . Influenza, High Dose Seasonal PF 01/06/2015, 12/21/2015, 09/30/2016  . Pneumococcal Conjugate-13 01/06/2015  . Pneumococcal Polysaccharide-23 04/27/2008  . Tdap 05/27/2011  . Zoster 05/27/2011   Screening Tests Health Maintenance  Topic Date Due  . TETANUS/TDAP  05/26/2021  . INFLUENZA VACCINE  Completed  . PNA vac Low Risk Adult  Completed      Plan:  I have personally reviewed and addressed the Medicare Annual Wellness questionnaire and have noted the following in the patient's chart:  A. Medical and social history B. Use of alcohol, tobacco or illicit drugs  C. Current medications and supplements D. Functional ability and status E.  Nutritional status F.  Physical activity G. Advance directives H. List of other physicians I.  Hospitalizations, surgeries, and ER visits in previous 12 months J.  [place] such as hearing and vision if needed, cognitive and depression L. Referrals and appointments - none  In addition, I have reviewed and discussed with patient certain preventive protocols, quality metrics, and best practice recommendations. A written personalized care plan for preventive services as well as general preventive health recommendations were provided to patient.  See attached scanned questionnaire for additional information.   Signed,  Fabio Neighbors, LPN Nurse Health Advisor   MD Recommendations: None.

## 2016-10-29 NOTE — Patient Instructions (Addendum)
   The CDC recommends two doses of Shingrix (the shingles vaccine) separated by 2 to 6 months for adults age 76 years and older. I recommend checking with your pharmacy plan regarding coverage for this vaccine.    Try OTC Debrox 3-4 nights a week to help prevent was build up in ear canals   Please go to the [facility] draw center in [address] on the second floor of [facility]

## 2016-10-29 NOTE — Patient Instructions (Signed)
[name] , Thank you for taking time to come for your Medicare Wellness Visit. I appreciate your ongoing commitment to your health goals. Please review the following plan we discussed and let me know if I can assist you in the future.   Screening recommendations/referrals: Colonoscopy: up to date Recommended yearly ophthalmology/optometry visit for glaucoma screening and checkup Recommended yearly dental visit for hygiene and checkup  Vaccinations: Influenza vaccine: completed Pneumococcal vaccine: completed series Tdap vaccine: up to date Shingles vaccine: completed 05/27/2011  Advanced directives: Please bring a copy of your POA (Power of [place]) and/or Living Will to your next appointment.   Conditions/risks identified: Recommend increasing water intake to 4-6 glasses a day.  Next appointment: 9:00 AM today  Preventive Care 6 Years and Older, Male Preventive care refers to lifestyle choices and visits with your health care provider that can promote health and wellness. What does preventive care include?  A yearly physical exam. This is also called an annual well check.  Dental exams once or twice a year.  Routine eye exams. Ask your health care provider how often you should have your eyes checked.  Personal lifestyle choices, including:  Daily care of your teeth and gums.  Regular physical activity.  Eating a healthy diet.  Avoiding tobacco and drug use.  Limiting alcohol use.  Practicing safe sex.  Taking low doses of aspirin every day.  Taking vitamin and mineral supplements as recommended by your health care provider. What happens during an annual well check? The services and screenings done by your health care provider during your annual well check will depend on your age, overall health, lifestyle risk factors, and family history of disease. Counseling  Your health care provider may ask you questions about your:  Alcohol use.  Tobacco use.  Drug  use.  Emotional well-being.  Home and relationship well-being.  Sexual activity.  Eating habits.  History of falls.  Memory and ability to understand (cognition).  Work and work Statistician. Screening  You may have the following tests or measurements:  Height, weight, and BMI.  Blood pressure.  Lipid and cholesterol levels. These may be checked every 5 years, or more frequently if you are over 58 years old.  Skin check.  Lung cancer screening. You may have this screening every year starting at age 8 if you have a 30-pack-year history of smoking and currently smoke or have quit within the past 15 years.  Fecal occult blood test (FOBT) of the stool. You may have this test every year starting at age 71.  Flexible sigmoidoscopy or colonoscopy. You may have a sigmoidoscopy every 5 years or a colonoscopy every 10 years starting at age 28.  Prostate cancer screening. Recommendations will vary depending on your family history and other risks.  Hepatitis C blood test.  Hepatitis B blood test.  Sexually transmitted disease (STD) testing.  Diabetes screening. This is done by checking your blood sugar (glucose) after you have not eaten for a while (fasting). You may have this done every 1-3 years.  Abdominal aortic aneurysm (AAA) screening. You may need this if you are a current or former smoker.  Osteoporosis. You may be screened starting at age 77 if you are at high risk. Talk with your health care provider about your test results, treatment options, and if necessary, the need for more tests. Vaccines  Your health care provider may recommend certain vaccines, such as:  Influenza vaccine. This is recommended every year.  Tetanus, diphtheria, and acellular  pertussis (Tdap, Td) vaccine. You may need a Td booster every 10 years.  Zoster vaccine. You may need this after age 60.  Pneumococcal 13-valent conjugate (PCV13) vaccine. One dose is recommended after age  58.  Pneumococcal polysaccharide (PPSV23) vaccine. One dose is recommended after age 57. Talk to your health care provider about which screenings and vaccines you need and how often you need them. This information is not intended to replace advice given to you by your health care provider. Make sure you discuss any questions you have with your health care provider. Document Released: 02/17/2015 Document Revised: 10/11/2015 Document Reviewed: 11/22/2014 Elsevier Interactive Patient Education  2017 [place] Prevention in the Home Falls can cause injuries. They can happen to people of all ages. There are many things you can do to make your home safe and to help prevent falls. What can I do on the outside of my home?  Regularly fix the edges of walkways and driveways and fix any cracks.  Remove anything that might make you trip as you walk through a door, such as a raised step or threshold.  Trim any bushes or trees on the path to your home.  Use bright outdoor lighting.  Clear any walking paths of anything that might make someone trip, such as rocks or tools.  Regularly check to see if handrails are loose or broken. Make sure that both sides of any steps have handrails.  Any raised decks and porches should have guardrails on the edges.  Have any leaves, snow, or ice cleared regularly.  Use sand or salt on walking paths during winter.  Clean up any spills in your garage right away. This includes oil or grease spills. What can I do in the bathroom?  Use night lights.  Install grab bars by the toilet and in the tub and shower. Do not use towel bars as grab bars.  Use non-skid mats or decals in the tub or shower.  If you need to sit down in the shower, use a plastic, non-slip stool.  Keep the floor dry. Clean up any water that spills on the floor as soon as it happens.  Remove soap buildup in the tub or shower regularly.  Attach bath mats securely with double-sided  non-slip rug tape.  Do not have throw rugs and other things on the floor that can make you trip. What can I do in the bedroom?  Use night lights.  Make sure that you have a light by your bed that is easy to reach.  Do not use any sheets or blankets that are too big for your bed. They should not hang down onto the floor.  Have a firm chair that has side arms. You can use this for support while you get dressed.  Do not have throw rugs and other things on the floor that can make you trip. What can I do in the kitchen?  Clean up any spills right away.  Avoid walking on wet floors.  Keep items that you use a lot in easy-to-reach places.  If you need to reach something above you, use a strong step stool that has a grab bar.  Keep electrical cords out of the way.  Do not use floor polish or wax that makes floors slippery. If you must use wax, use non-skid floor wax.  Do not have throw rugs and other things on the floor that can make you trip. What can I do with my stairs?  Do not leave any items on the stairs.  Make sure that there are handrails on both sides of the stairs and use them. Fix handrails that are broken or loose. Make sure that handrails are as long as the stairways.  Check any carpeting to make sure that it is firmly attached to the stairs. Fix any carpet that is loose or worn.  Avoid having throw rugs at the top or bottom of the stairs. If you do have throw rugs, attach them to the floor with carpet tape.  Make sure that you have a light switch at the top of the stairs and the bottom of the stairs. If you do not have them, ask someone to add them for you. What else can I do to help prevent falls?  Wear shoes that:  Do not have high heels.  Have rubber bottoms.  Are comfortable and fit you well.  Are closed at the toe. Do not wear sandals.  If you use a stepladder:  Make sure that it is fully opened. Do not climb a closed stepladder.  Make sure that both  sides of the stepladder are locked into place.  Ask someone to hold it for you, if possible.  Clearly mark and make sure that you can see:  Any grab bars or handrails.  First and last steps.  Where the edge of each step is.  Use tools that help you move around (mobility aids) if they are needed. These include:  Canes.  Walkers.  Scooters.  Crutches.  Turn on the lights when you go into a dark area. Replace any light bulbs as soon as they burn out.  Set up your furniture so you have a clear path. Avoid moving your furniture around.  If any of your floors are uneven, fix them.  If there are any pets around you, be aware of where they are.  Review your medicines with your doctor. Some medicines can make you feel dizzy. This can increase your chance of falling. Ask your doctor what other things that you can do to help prevent falls. This information is not intended to replace advice given to you by your health care provider. Make sure you discuss any questions you have with your health care provider. Document Released: 11/17/2008 Document Revised: 06/29/2015 Document Reviewed: 02/25/2014 Elsevier Interactive Patient Education  2017 [employer].

## 2016-10-29 NOTE — Progress Notes (Signed)
Patient: Gregory Arroyo, Male    DOB: 1940/03/25, 76 y.o.   MRN: [id number] Visit Date: 10/29/2016  Today's Provider: Lelon Huh, MD   Chief Complaint  Patient presents with  . Annual Exam  . Allergies   Subjective:    Annual physical exam Gregory Arroyo is a 76 y.o. male who presents today for health maintenance and complete physical. He feels fairly well. He reports exercising daily. He reports he is sleeping well.  ----------------------------------------------------------------- Follow up of Reactive Airway Disease:   Patient was last seen for this problem 5 months ago; changes made during that visit includes adding Singulair 10mg  daily. Patient reports poor compliance with treatment. Patient states this problem is controlled with Claritin and Zyrtec.    Prediabetes, Follow-up:   Lab Results  Component Value Date   HGBA1C 6.0 (H) 09/30/2014   HGBA1C 5.8 07/29/2013   GLUCOSE 94 09/28/2015   GLUCOSE 99 05/06/2015   GLUCOSE 93 09/30/2014    Last seen for for this 2 years ago.  Management since that visit includes no changes. Current symptoms include none and have been stable.  Weight trend: fluctuating a bit Prior visit with dietician: no Current diet: well balanced Current exercise: walking and yard work  Pertinent Labs:    Component Value Date/Time   CHOL 195 07/29/2013   TRIG 170 (A) 07/29/2013   CREATININE 0.84 09/28/2015 0839    Wt Readings from Last 3 Encounters:  10/29/16 154 lb 6.4 oz (70 kg)  09/30/16 153 lb (69.4 kg)  03/28/16 155 lb (70.3 kg)     Review of Systems  Constitutional: Negative for appetite change, chills, fatigue and fever.  HENT: Negative for congestion, ear pain, hearing loss, nosebleeds and trouble swallowing.   Eyes: Negative for pain and visual disturbance.  Respiratory: Negative for cough, chest tightness and shortness of breath.   Cardiovascular: Negative for chest pain, palpitations and leg swelling.    Gastrointestinal: Negative for abdominal pain, blood in stool, constipation, diarrhea, nausea and vomiting.  Endocrine: Negative for polydipsia, polyphagia and polyuria.  Genitourinary: Negative for dysuria and flank pain.  Musculoskeletal: Negative for arthralgias, back pain, joint swelling, myalgias and neck stiffness.  Skin: Negative for color change, rash and wound.  Neurological: Negative for dizziness, tremors, seizures, speech difficulty, weakness, light-headedness and headaches.  Psychiatric/Behavioral: Negative for behavioral problems, confusion, decreased concentration, dysphoric mood and sleep disturbance. The patient is not nervous/anxious.   All other systems reviewed and are negative.   Social History      He  reports that he quit smoking about 58 years ago. His smoking use included Cigarettes. He has a 2.00 pack-year smoking history. He has never used smokeless tobacco. He reports that he drinks alcohol. He reports that he does not use drugs.       Social History   Social History  . Marital status: Married    Spouse name: N/A  . Number of children: 4  . Years of education: N/A   Occupational History  . 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 Smoking status: Former Smoker    Packs/day: 1.00    Years: 2.00    Types: Cigarettes    Quit date: 02/04/1958  . Smokeless tobacco: Never Used  . Alcohol use 0.0 oz/week     Comment: occasional use; drinks hard liquor once a week or  less  . Drug use: No  . Sexual activity: Not on file   Other Topics Concern  . Not on file   Social History Narrative  . No narrative on file    Past Medical History:  Diagnosis Date  . History of chicken pox   . History of measles      Patient Active Problem List   Diagnosis Date Noted  . Reactive airway disease 05/08/2015  . Sinusitis 04/17/2015  . Bilateral wrist pain 09/30/2014   . H/O adenomatous polyp of colon 08/29/2014  . Pre-diabetes 08/29/2014  . Skin lesion of face 08/29/2014  . Vertigo 08/29/2014  . External hemorrhoids without complication 07/37/1062  . Testicular hypofunction 05/06/2008  . Family history of colon cancer 04/28/2008  . Allergic rhinitis 04/27/2008  . Decreased libido 04/27/2008    Past Surgical History:  Procedure Laterality Date  . CATARACT EXTRACTION Right 04/2011   Dr. Martie Round, [place]  . HERNIA REPAIR Right 2010   Dr. Bary Castilla; Inguinal Hernia  . [place]  2001  . SHOULDER SURGERY Left 2009   Dr. Sabra Heck    Family History        Family Status  Relation Status  . Mother Deceased at age 88       Cause of death: Stroke  . Father Deceased at age 50       Cause of death: Heart attack  . Sister Alive  . Sister Alive  . Sister Alive        His family history includes Colon cancer (age of onset: 34) in his sister; Heart attack in his father; Heart disease in his father and mother; Stroke in his mother; Transient ischemic attack in his mother.     Allergies  Allergen Reactions  . Pollen Extract      Current Outpatient Prescriptions:  .  albuterol (PROVENTIL HFA;VENTOLIN HFA) 108 (90 Base) MCG/ACT inhaler, Inhale 2 puffs into the lungs every 6 (six) hours as needed for wheezing or shortness of breath. Please dispense one adult spacer for use with MDI., Disp: 1 Inhaler, Rfl: 0 .  aspirin 81 MG tablet, Take 1 tablet by mouth daily., Disp: , Rfl:  .  Cetirizine HCl (ZYRTEC ALLERGY) 10 MG CAPS, Take 1 tablet by mouth daily. , Disp: , Rfl:  .  fluticasone (FLONASE) 50 MCG/ACT nasal spray, Place 2 sprays into both nostrils daily. (Patient taking differently: Place 2 sprays into both nostrils daily. ), Disp: 16 g, Rfl: 5 .  loratadine (CLARITIN) 10 MG tablet, Take 1 tablet by mouth daily as needed., Disp: , Rfl:  .  Omega-3 Fatty Acids (FISH OIL PO), Take 1 capsule by mouth daily., Disp: ,  Rfl:    Patient Care Team: Birdie Sons, MD as PCP - General (Family Medicine)      Objective:   Vitals: BP 118/62 (BP Location: Right Arm, Cuff Size: Normal)    Vitals:   10/29/16 0912 10/29/16 0913  BP: 124/70 118/62     Vital Signs - Last Recorded  Most recent update: 10/29/2016 8:52 AM by Fabio Neighbors, LPN  BP    694/85 (BP Location: Left Arm)     Pulse  80     Temp    97.5 F (36.4 C) (Oral)     Ht  5\' 8"  (1.727 m)     Wt  154 lb 6.4 oz (70 kg)      BMI  23.48 kg/m  Physical Exam   General Appearance:    Alert, cooperative, no distress, appears stated age  Head:    Normocephalic, without obvious abnormality, atraumatic  Eyes:    PERRL, conjunctiva/corneas clear, EOM's intact, fundi    benign, both eyes       Ears:    Normal TM's and external ear canals, both ears  Nose:   Nares normal, septum midline, mucosa normal, no drainage   or sinus tenderness  Throat:   Lips, mucosa, and tongue normal; teeth and gums normal  Neck:   Supple, symmetrical, trachea midline, no adenopathy;       thyroid:  No enlargement/tenderness/nodules; no carotid   bruit or JVD  Back:     Symmetric, no curvature, ROM normal, no CVA tenderness  Lungs:     Clear to auscultation bilaterally, respirations unlabored  Chest wall:    No tenderness or deformity  Heart:    Regular rate and rhythm, S1 and S2 normal, no murmur, rub   or gallop  Abdomen:     Soft, non-tender, bowel sounds active all four quadrants,    no masses, no organomegaly  Genitalia:    deferred  Rectal:    deferred  Extremities:   Extremities normal, atraumatic, no cyanosis or edema  Pulses:   2+ and symmetric all extremities  Skin:   Skin color, texture, turgor normal, no rashes or lesions  Lymph nodes:   Cervical, supraclavicular, and axillary nodes normal  Neurologic:   CNII-XII intact. Normal strength, sensation and reflexes      throughout    Depression Screen PHQ 2/9 Scores 10/29/2016  09/30/2016 09/30/2014 09/30/2014  PHQ - 2 Score 0 0 0 0  PHQ- 9 Score - - 0 -      Assessment & Plan:     Routine Health Maintenance and Physical Exam  Exercise Activities and Dietary recommendations Goals    None      Immunization History  Administered Date(s) Administered  . Influenza, High Dose Seasonal PF 01/06/2015, 12/21/2015, 09/30/2016  . Pneumococcal Conjugate-13 01/06/2015  . Pneumococcal Polysaccharide-23 04/27/2008  . Tdap 05/27/2011  . Zoster 05/27/2011    Health Maintenance  Topic Date Due  . Samul Dada  05/26/2021  . INFLUENZA VACCINE  Completed  . PNA vac Low Risk Adult  Completed     Discussed health benefits of physical activity, and encouraged him to engage in regular exercise appropriate for his age and condition.    --------------------------------------------------------------------  1. Annual physical exam Generally doing very well.   2. Prostate cancer screening  - PSA  3. Pre-diabetes  - Hemoglobin A1c    Lelon Huh, MD  [facility]

## 2016-10-30 ENCOUNTER — Encounter: Payer: Self-pay | Admitting: Family Medicine

## 2016-10-30 LAB — HEMOGLOBIN A1C
EAG (MMOL/L): 6.3 (calc)
Hgb A1c MFr Bld: 5.6 % of total Hgb (ref <5.7)
Mean Plasma Glucose: 114 (calc)

## 2016-10-30 LAB — PSA: PSA: 0.6 ng/mL (ref <=4.0)

## 2016-11-05 DIAGNOSIS — Z85828 Personal history of other malignant neoplasm of skin: Secondary | ICD-10-CM | POA: Diagnosis not present

## 2016-11-05 DIAGNOSIS — D1800 Hemangioma unspecified site: Secondary | ICD-10-CM | POA: Diagnosis not present

## 2016-11-05 DIAGNOSIS — Z1283 Encounter for screening for malignant neoplasm of skin: Secondary | ICD-10-CM | POA: Diagnosis not present

## 2016-11-05 DIAGNOSIS — L853 Xerosis cutis: Secondary | ICD-10-CM | POA: Diagnosis not present

## 2016-11-05 DIAGNOSIS — L821 Other seborrheic keratosis: Secondary | ICD-10-CM | POA: Diagnosis not present

## 2016-11-05 DIAGNOSIS — L814 Other melanin hyperpigmentation: Secondary | ICD-10-CM | POA: Diagnosis not present

## 2016-11-05 DIAGNOSIS — L578 Other skin changes due to chronic exposure to nonionizing radiation: Secondary | ICD-10-CM | POA: Diagnosis not present

## 2016-11-05 DIAGNOSIS — L570 Actinic keratosis: Secondary | ICD-10-CM | POA: Diagnosis not present

## 2016-11-08 DIAGNOSIS — S0502XA Injury of conjunctiva and corneal abrasion without foreign body, left eye, initial encounter: Secondary | ICD-10-CM | POA: Diagnosis not present

## 2016-11-08 DIAGNOSIS — T1512XA Foreign body in conjunctival sac, left eye, initial encounter: Secondary | ICD-10-CM | POA: Diagnosis not present

## 2016-11-11 DIAGNOSIS — T1512XA Foreign body in conjunctival sac, left eye, initial encounter: Secondary | ICD-10-CM | POA: Diagnosis not present

## 2016-11-11 DIAGNOSIS — S0502XD Injury of conjunctiva and corneal abrasion without foreign body, left eye, subsequent encounter: Secondary | ICD-10-CM | POA: Diagnosis not present

## 2016-11-27 DIAGNOSIS — R69 Illness, unspecified: Secondary | ICD-10-CM | POA: Diagnosis not present

## 2016-12-02 ENCOUNTER — Other Ambulatory Visit: Payer: Self-pay | Admitting: Family Medicine

## 2016-12-02 MED ORDER — FLUTICASONE PROPIONATE 50 MCG/ACT NA SUSP: 2.0000 | Freq: Every day | NASAL | 5 refills | Status: DC

## 2016-12-02 NOTE — Telephone Encounter (Signed)
Pt contacted office for refill request on the following medications:  fluticasone (FLONASE) 50 MCG/ACT nasal spray  [facility].  VU#[phone]/HU

## 2016-12-04 ENCOUNTER — Other Ambulatory Visit: Payer: Self-pay | Admitting: Family Medicine

## 2016-12-04 MED ORDER — FLUTICASONE PROPIONATE 50 MCG/ACT NA SUSP: 2.0000 | Freq: Every day | NASAL | 5 refills | Status: AC

## 2016-12-04 NOTE — Telephone Encounter (Signed)
Pt requested refill on fluticasone (FLONASE) 50 MCG/ACT nasal spray and request it to be sent to [facility] [address]. It was sent to [facility]. Pt is requesting that it be [facility] to [facility] today if possible. Please advise. Thanks TNP

## 2016-12-04 NOTE — Telephone Encounter (Signed)
[facility].  

## 2016-12-11 DIAGNOSIS — R69 Illness, unspecified: Secondary | ICD-10-CM | POA: Diagnosis not present

## 2017-01-06 DIAGNOSIS — R69 Illness, unspecified: Secondary | ICD-10-CM | POA: Diagnosis not present

## 2017-01-15 IMAGING — CR DG SHOULDER 2+V*R*
1 series · 3 of 3 positions shown · non-contrast
Comparison: Chest radiograph 05/16/2003

CLINICAL DATA: Deltoid tendinitis of right shoulder. Injury while
moving furniture 1 week ago.

EXAM:
RIGHT SHOULDER - 2+ VIEW

[Series 1: dg shoulder right · 0.14mm/px · 3 of 3 slices shown]
[im 1/3]
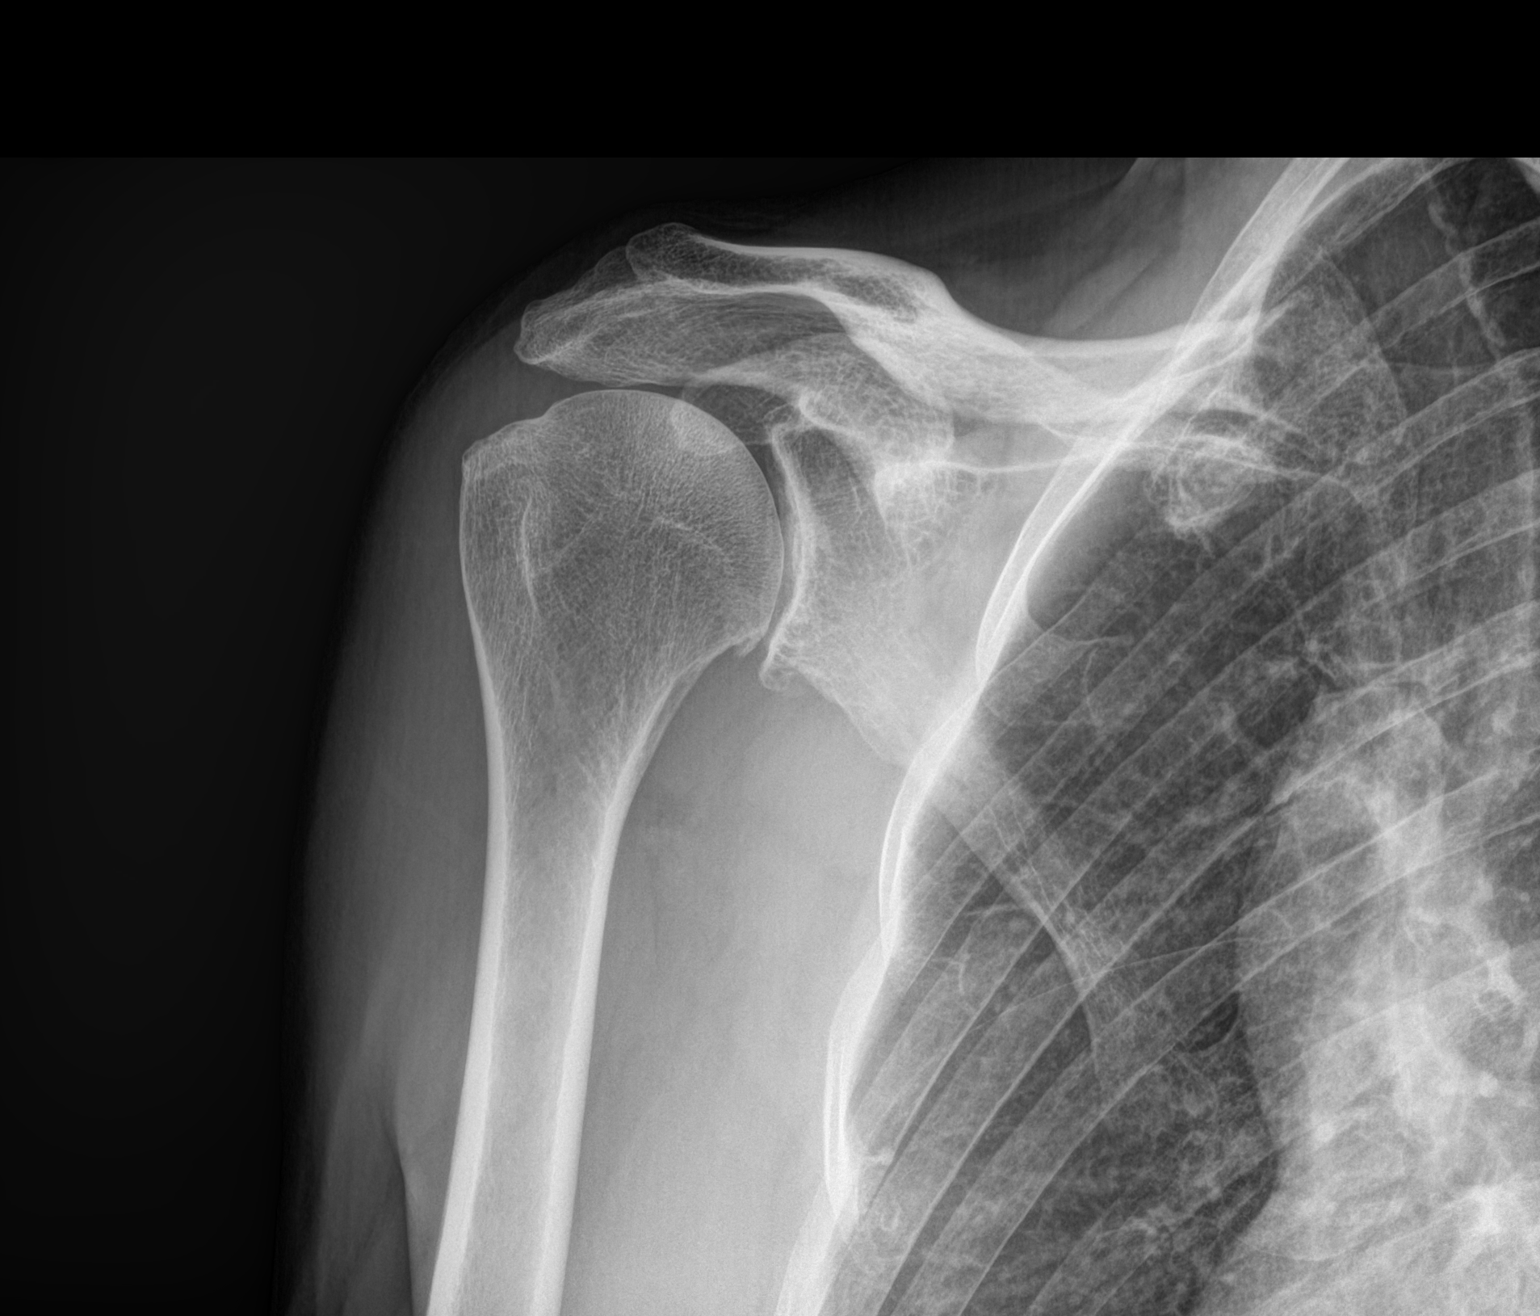
[im 2/3]
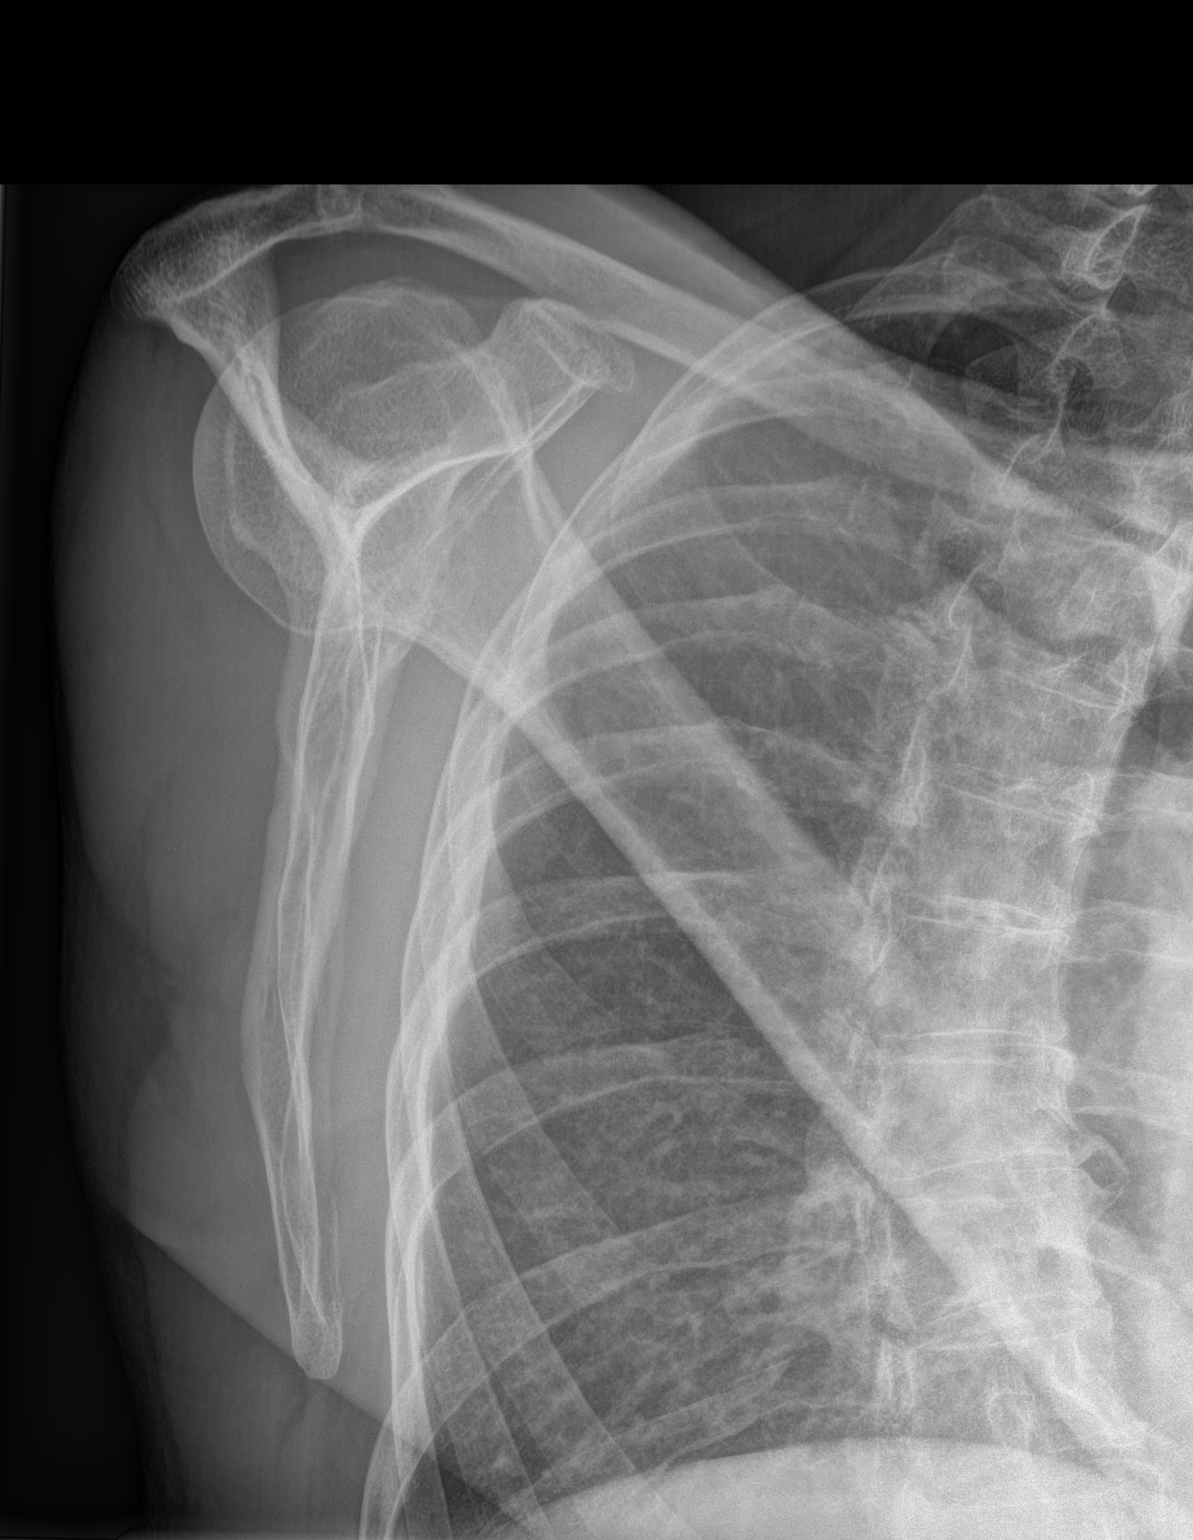
[im 3/3]
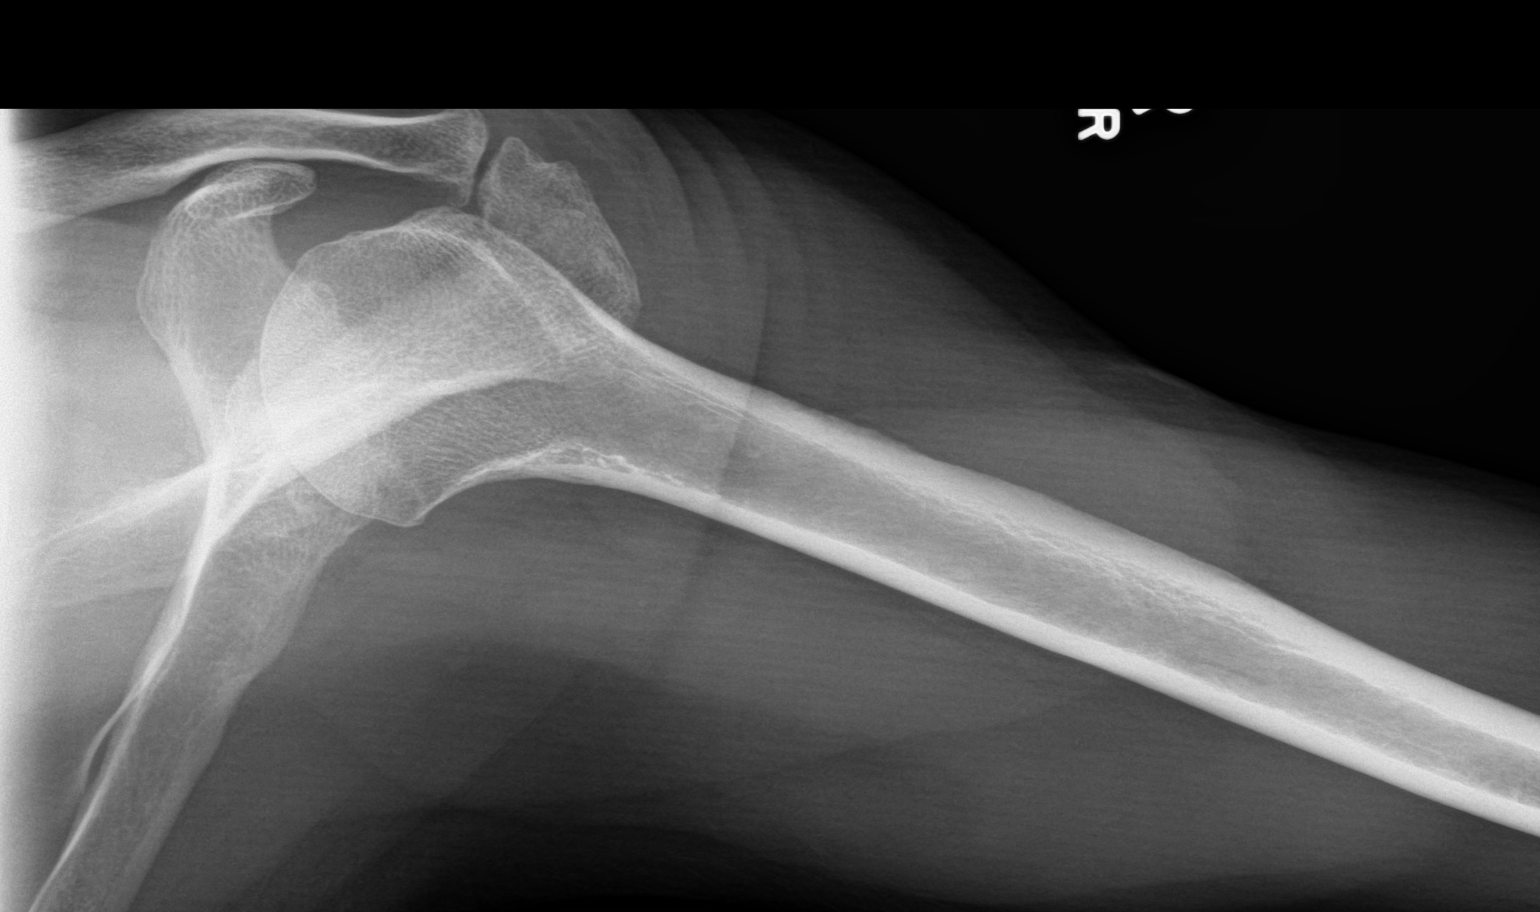

[3 of 3 positions shown; findings below may reference images not displayed]

FINDINGS: Osteophytosis and sclerosis at the right glenohumeral joint
compatible with degenerative disease. Right AC joint is intact.
Visualized right ribs are intact. Right shoulder is located without
fracture. Mild degenerative changes at the right AC joint.
IMPRESSION: No acute abnormality.

Right shoulder degenerative changes.

## 2017-01-30 ENCOUNTER — Encounter: Payer: Self-pay | Admitting: Family Medicine

## 2017-01-30 ENCOUNTER — Ambulatory Visit (INDEPENDENT_AMBULATORY_CARE_PROVIDER_SITE_OTHER): Payer: Medicare HMO | Admitting: Family Medicine

## 2017-01-30 VITALS — BP 122/70 | HR 66 | Temp 97.7°F | Resp 16 | Wt 156.0 lb

## 2017-01-30 DIAGNOSIS — J329 Chronic sinusitis, unspecified: Secondary | ICD-10-CM

## 2017-01-30 MED ORDER — AMOXICILLIN 500 MG PO CAPS: 1000.0000 mg | ORAL_CAPSULE | Freq: Two times a day (BID) | ORAL | 0 refills | Status: DC

## 2017-01-30 NOTE — Progress Notes (Signed)
Patient: Gregory Arroyo Male    DOB: 11/20/1940   76 y.o.   MRN: [id number] Visit Date: 01/30/2017  Today's Provider: Lelon Huh, MD   Chief Complaint  Patient presents with  . Sinusitis    x 3 days   Subjective:    Sinusitis  This is a new problem. Episode onset: 3 days ago. The problem has been gradually worsening since onset. There has been no fever. Associated symptoms include congestion (sinus), coughing (dry), a hoarse voice, shortness of breath, sinus pressure, sneezing, a sore throat and swollen glands. Pertinent negatives include no chills, diaphoresis, ear pain, headaches or neck pain. Treatments tried: Ibuprofen and Muncinex. The treatment provided no relief.       Allergies  Allergen Reactions  . Pollen Extract      Current Outpatient Medications:  .  albuterol (PROVENTIL HFA;VENTOLIN HFA) 108 (90 Base) MCG/ACT inhaler, Inhale 2 puffs into the lungs every 6 (six) hours as needed for wheezing or shortness of breath. Please dispense one adult spacer for use with MDI., Disp: 1 Inhaler, Rfl: 0 .  aspirin 81 MG tablet, Take 1 tablet by mouth daily., Disp: , Rfl:  .  Cetirizine HCl (ZYRTEC ALLERGY) 10 MG CAPS, Take 1 tablet by mouth daily. , Disp: , Rfl:  .  fluticasone (FLONASE) 50 MCG/ACT nasal spray, Place 2 sprays into both nostrils daily., Disp: 16 g, Rfl: 5 .  loratadine (CLARITIN) 10 MG tablet, Take 1 tablet by mouth daily as needed., Disp: , Rfl:  .  Omega-3 Fatty Acids (FISH OIL PO), Take 1 capsule by mouth daily., Disp: , Rfl:   Review of Systems  Constitutional: Negative for appetite change, chills, diaphoresis and fever.  HENT: Positive for congestion (sinus), hearing loss (decrease hearing), hoarse voice, nosebleeds, postnasal drip, rhinorrhea, sinus pressure, sneezing and sore throat. Negative for ear pain.   Respiratory: Positive for cough (dry) and shortness of breath. Negative for chest tightness and wheezing.   Cardiovascular: Negative for  chest pain and palpitations.  Gastrointestinal: Negative for abdominal pain, nausea and vomiting.  Musculoskeletal: Negative for neck pain.  Neurological: Negative for headaches.    Social History   Tobacco Use  . Smoking status: Former Smoker    Packs/day: 1.00    Years: 2.00    Pack years: 2.00    Types: Cigarettes    Last attempt to quit: 02/04/1958    Years since quitting: 59.0  . Smokeless tobacco: Never Used  Substance Use Topics  . Alcohol use: Yes    Alcohol/week: 0.6 oz    Types: 1 Glasses of wine per week   Objective:   BP 122/70 (BP Location: Right Arm, Patient Position: Sitting, Cuff Size: Large)   Pulse 66   Temp 97.7 F (36.5 C) (Oral)   Resp 16   Wt 156 lb (70.8 kg)   SpO2 97% Comment: room air  BMI 23.72 kg/m  Vitals:   01/30/17 1139  BP: 122/70  Pulse: 66  Resp: 16  Temp: 97.7 F (36.5 C)  TempSrc: Oral  SpO2: 97%  Weight: 156 lb (70.8 kg)     Physical Exam  General Appearance:    Alert, cooperative, no distress  HENT:   bilateral TM normal without fluid or infection, neck without nodes, frontal sinus tender and nasal mucosa congested  Eyes:    PERRL, conjunctiva/corneas clear, EOM's intact       Lungs:     Clear to auscultation bilaterally, respirations unlabored  Heart:    Regular rate and rhythm  Neurologic:   Awake, alert, oriented x 3. No apparent focal neurological           defect.           Assessment & Plan:     1. Sinusitis, unspecified chronicity, unspecified location  - amoxicillin (AMOXIL) 500 MG capsule; Take 2 capsules (1,000 mg total) by mouth 2 (two) times daily for 7 days.  Dispense: 28 capsule; Refill: 0  Call if symptoms change or if not rapidly improving.          Lelon Huh, MD  [facility]

## 2017-05-29 DIAGNOSIS — R69 Illness, unspecified: Secondary | ICD-10-CM | POA: Diagnosis not present

## 2017-09-01 DIAGNOSIS — R69 Illness, unspecified: Secondary | ICD-10-CM | POA: Diagnosis not present

## 2017-09-15 DIAGNOSIS — R69 Illness, unspecified: Secondary | ICD-10-CM | POA: Diagnosis not present

## 2017-09-22 ENCOUNTER — Telehealth: Payer: Self-pay

## 2017-09-22 NOTE — Telephone Encounter (Signed)
LMTCB and schedule AWV for anytime after 10/29/17. -MM

## 2017-09-30 DIAGNOSIS — R69 Illness, unspecified: Secondary | ICD-10-CM | POA: Diagnosis not present

## 2017-10-02 NOTE — Telephone Encounter (Signed)
Scheduled AWV for 10/31/17 @ 8:40 AM. -MM

## 2017-10-31 ENCOUNTER — Ambulatory Visit (INDEPENDENT_AMBULATORY_CARE_PROVIDER_SITE_OTHER): Payer: Medicare HMO

## 2017-10-31 ENCOUNTER — Encounter: Payer: Self-pay | Admitting: Family Medicine

## 2017-10-31 ENCOUNTER — Ambulatory Visit (INDEPENDENT_AMBULATORY_CARE_PROVIDER_SITE_OTHER): Payer: Medicare HMO | Admitting: Family Medicine

## 2017-10-31 VITALS — BP 138/66 | HR 77 | Temp 98.3°F | Ht 68.0 in | Wt 154.6 lb

## 2017-10-31 VITALS — BP 122/75 | HR 74 | Temp 98.1°F | Resp 16 | Wt 154.0 lb

## 2017-10-31 DIAGNOSIS — Z23 Encounter for immunization: Secondary | ICD-10-CM

## 2017-10-31 DIAGNOSIS — Z0000 Encounter for general adult medical examination without abnormal findings: Secondary | ICD-10-CM

## 2017-10-31 DIAGNOSIS — J329 Chronic sinusitis, unspecified: Secondary | ICD-10-CM | POA: Diagnosis not present

## 2017-10-31 MED ORDER — AMOXICILLIN 500 MG PO CAPS: 1000.0000 mg | ORAL_CAPSULE | Freq: Two times a day (BID) | ORAL | 0 refills | Status: DC

## 2017-10-31 NOTE — Patient Instructions (Signed)
[name] , Thank you for taking time to come for your Medicare Wellness Visit. I appreciate your ongoing commitment to your health goals. Please review the following plan we discussed and let me know if I can assist you in the future.   Screening recommendations/referrals: Colonoscopy: Up to date Recommended yearly ophthalmology/optometry visit for glaucoma screening and checkup Recommended yearly dental visit for hygiene and checkup  Vaccinations: Influenza vaccine: Pt declines today.  Pneumococcal vaccine: Up to date Tdap vaccine: Up to date Shingles vaccine: Up to date    Advanced directives: Please bring a copy of your POA (Power of [place]) and/or Living Will to your next appointment.   Conditions/risks identified: Recommend increasing water intake to 4-6 glasses a day.   Next appointment: today @ 11:20 AM with Dr Caryn Section.  Preventive Care 77 Years and Older, Male Preventive care refers to lifestyle choices and visits with your health care provider that can promote health and wellness. What does preventive care include?  A yearly physical exam. This is also called an annual well check.  Dental exams once or twice a year.  Routine eye exams. Ask your health care provider how often you should have your eyes checked.  Personal lifestyle choices, including:  Daily care of your teeth and gums.  Regular physical activity.  Eating a healthy diet.  Avoiding tobacco and drug use.  Limiting alcohol use.  Practicing safe sex.  Taking low doses of aspirin every day.  Taking vitamin and mineral supplements as recommended by your health care provider. What happens during an annual well check? The services and screenings done by your health care provider during your annual well check will depend on your age, overall health, lifestyle risk factors, and family history of disease. Counseling  Your health care provider may ask you questions about your:  Alcohol use.  Tobacco  use.  Drug use.  Emotional well-being.  Home and relationship well-being.  Sexual activity.  Eating habits.  History of falls.  Memory and ability to understand (cognition).  Work and work Statistician. Screening  You may have the following tests or measurements:  Height, weight, and BMI.  Blood pressure.  Lipid and cholesterol levels. These may be checked every 5 years, or more frequently if you are over 42 years old.  Skin check.  Lung cancer screening. You may have this screening every year starting at age 69 if you have a 30-pack-year history of smoking and currently smoke or have quit within the past 15 years.  Fecal occult blood test (FOBT) of the stool. You may have this test every year starting at age 20.  Flexible sigmoidoscopy or colonoscopy. You may have a sigmoidoscopy every 5 years or a colonoscopy every 10 years starting at age 70.  Prostate cancer screening. Recommendations will vary depending on your family history and other risks.  Hepatitis C blood test.  Hepatitis B blood test.  Sexually transmitted disease (STD) testing.  Diabetes screening. This is done by checking your blood sugar (glucose) after you have not eaten for a while (fasting). You may have this done every 1-3 years.  Abdominal aortic aneurysm (AAA) screening. You may need this if you are a current or former smoker.  Osteoporosis. You may be screened starting at age 32 if you are at high risk. Talk with your health care provider about your test results, treatment options, and if necessary, the need for more tests. Vaccines  Your health care provider may recommend certain vaccines, such as:  Influenza vaccine. This is recommended every year.  Tetanus, diphtheria, and acellular pertussis (Tdap, Td) vaccine. You may need a Td booster every 10 years.  Zoster vaccine. You may need this after age 17.  Pneumococcal 13-valent conjugate (PCV13) vaccine. One dose is recommended after age  66.  Pneumococcal polysaccharide (PPSV23) vaccine. One dose is recommended after age 72. Talk to your health care provider about which screenings and vaccines you need and how often you need them. This information is not intended to replace advice given to you by your health care provider. Make sure you discuss any questions you have with your health care provider. Document Released: 02/17/2015 Document Revised: 10/11/2015 Document Reviewed: 11/22/2014 Elsevier Interactive Patient Education  2017 [place] Prevention in the Home Falls can cause injuries. They can happen to people of all ages. There are many things you can do to make your home safe and to help prevent falls. What can I do on the outside of my home?  Regularly fix the edges of walkways and driveways and fix any cracks.  Remove anything that might make you trip as you walk through a door, such as a raised step or threshold.  Trim any bushes or trees on the path to your home.  Use bright outdoor lighting.  Clear any walking paths of anything that might make someone trip, such as rocks or tools.  Regularly check to see if handrails are loose or broken. Make sure that both sides of any steps have handrails.  Any raised decks and porches should have guardrails on the edges.  Have any leaves, snow, or ice cleared regularly.  Use sand or salt on walking paths during winter.  Clean up any spills in your garage right away. This includes oil or grease spills. What can I do in the bathroom?  Use night lights.  Install grab bars by the toilet and in the tub and shower. Do not use towel bars as grab bars.  Use non-skid mats or decals in the tub or shower.  If you need to sit down in the shower, use a plastic, non-slip stool.  Keep the floor dry. Clean up any water that spills on the floor as soon as it happens.  Remove soap buildup in the tub or shower regularly.  Attach bath mats securely with double-sided  non-slip rug tape.  Do not have throw rugs and other things on the floor that can make you trip. What can I do in the bedroom?  Use night lights.  Make sure that you have a light by your bed that is easy to reach.  Do not use any sheets or blankets that are too big for your bed. They should not hang down onto the floor.  Have a firm chair that has side arms. You can use this for support while you get dressed.  Do not have throw rugs and other things on the floor that can make you trip. What can I do in the kitchen?  Clean up any spills right away.  Avoid walking on wet floors.  Keep items that you use a lot in easy-to-reach places.  If you need to reach something above you, use a strong step stool that has a grab bar.  Keep electrical cords out of the way.  Do not use floor polish or wax that makes floors slippery. If you must use wax, use non-skid floor wax.  Do not have throw rugs and other things on the floor that  can make you trip. What can I do with my stairs?  Do not leave any items on the stairs.  Make sure that there are handrails on both sides of the stairs and use them. Fix handrails that are broken or loose. Make sure that handrails are as long as the stairways.  Check any carpeting to make sure that it is firmly attached to the stairs. Fix any carpet that is loose or worn.  Avoid having throw rugs at the top or bottom of the stairs. If you do have throw rugs, attach them to the floor with carpet tape.  Make sure that you have a light switch at the top of the stairs and the bottom of the stairs. If you do not have them, ask someone to add them for you. What else can I do to help prevent falls?  Wear shoes that:  Do not have high heels.  Have rubber bottoms.  Are comfortable and fit you well.  Are closed at the toe. Do not wear sandals.  If you use a stepladder:  Make sure that it is fully opened. Do not climb a closed stepladder.  Make sure that both  sides of the stepladder are locked into place.  Ask someone to hold it for you, if possible.  Clearly mark and make sure that you can see:  Any grab bars or handrails.  First and last steps.  Where the edge of each step is.  Use tools that help you move around (mobility aids) if they are needed. These include:  Canes.  Walkers.  Scooters.  Crutches.  Turn on the lights when you go into a dark area. Replace any light bulbs as soon as they burn out.  Set up your furniture so you have a clear path. Avoid moving your furniture around.  If any of your floors are uneven, fix them.  If there are any pets around you, be aware of where they are.  Review your medicines with your doctor. Some medicines can make you feel dizzy. This can increase your chance of falling. Ask your doctor what other things that you can do to help prevent falls. This information is not intended to replace advice given to you by your health care provider. Make sure you discuss any questions you have with your health care provider. Document Released: 11/17/2008 Document Revised: 06/29/2015 Document Reviewed: 02/25/2014 Elsevier Interactive Patient Education  2017 [employer].

## 2017-10-31 NOTE — Addendum Note (Signed)
Addended by: Julieta Bellini on: 10/31/2017 12:26 PM   Modules accepted: Orders

## 2017-10-31 NOTE — Progress Notes (Addendum)
Subjective:   Gregory Arroyo is a 77 y.o. male who presents for Medicare Annual/Subsequent preventive examination.  Review of Systems:  N/A  Cardiac Risk Factors include: advanced age (>37men, >45 women)     Objective:    Vitals: BP 138/66 (BP Location: Right Arm)   Pulse 77   Temp 98.3 F (36.8 C) (Oral)   Ht 5\' 8"  (1.727 m)   Wt 154 lb 9.6 oz (70.1 kg)   BMI 23.51 kg/m   Body mass index is 23.51 kg/m.  Advanced Directives 10/31/2017 10/29/2016 05/06/2015  Does Patient Have a Medical Advance Directive? Yes Yes No  Type of Paramedic of Gregory Arroyo;Living will Gregory Arroyo;Living will -  Copy of Gregory Arroyo in Chart? No - copy requested No - copy requested -  Would patient like information on creating a medical advance directive? - - Yes - Educational materials given    Tobacco Social History   Tobacco Use  Smoking Status Former Smoker  . Packs/day: 1.00  . Years: 2.00  . Pack years: 2.00  . Types: Cigarettes  . Last attempt to quit: 02/04/1958  . Years since quitting: 59.7  Smokeless Tobacco Never Used     Counseling given: Not Answered   Clinical Intake:  Pre-visit preparation completed: Yes  Pain : No/denies pain Pain Score: 0-No pain     Nutritional Status: BMI of 19-24  Normal Nutritional Risks: None Diabetes: No  How often do you need to have someone help you when you read instructions, pamphlets, or other written materials from your doctor or pharmacy?: 1 - Never  Interpreter Needed?: No  Information entered by :: MMarkoski, LPN  Past Medical History:  Diagnosis Date  . History of chicken pox   . History of measles    Past Surgical History:  Procedure Laterality Date  . CATARACT EXTRACTION Right 04/2011   Dr. Martie Arroyo, Pace  . HERNIA REPAIR Right 2010   Dr. Bary Arroyo; Inguinal Hernia  . [place]  2001  . SHOULDER SURGERY Left 2009   Dr.  Sabra Arroyo   Family History  Problem Relation Age of Onset  . Stroke Mother   . Heart disease Mother   . Transient ischemic attack Mother   . Heart attack Father   . Heart disease Father   . Colon cancer Sister 32   Social History   Socioeconomic History  . Marital status: Married    Spouse name: Not on file  . Number of children: 4  . Years of education: Not on file  . Highest education level: Associate degree: occupational, Hotel manager, or vocational program  Occupational History  . 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 Financial resource strain: Not hard at all  . Food insecurity:    Worry: Never true    Inability: Never true  . Transportation needs:    Medical: No    Non-medical: No  Tobacco Use  . Smoking status: Former Smoker    Packs/day: 1.00    Years: 2.00    Pack years: 2.00    Types: Cigarettes    Last attempt to quit: 02/04/1958    Years since quitting: 59.7  . Smokeless tobacco: Never Used  Substance and Sexual Activity  . Alcohol use: Yes    Alcohol/week: 1.0 standard drinks    Types:  1 Glasses of wine per week  . Drug use: No  . Sexual activity: Not on file  Lifestyle  . Physical activity:    Days per week: Not on file    Minutes per session: Not on file  . Stress: Not at all  Relationships  . Social connections:    Talks on phone: Not on file    Gets together: Not on file    Attends religious service: Not on file    Active member of club or organization: Not on file    Attends meetings of clubs or organizations: Not on file    Relationship status: Not on file  Other Topics Concern  . Not on file  Social History Narrative  . Not on file    Outpatient Encounter Medications as of 10/31/2017  Medication Sig  . albuterol (PROVENTIL HFA;VENTOLIN HFA) 108 (90 Base) MCG/ACT inhaler Inhale 2 puffs into the lungs every 6 (six) hours as needed for  wheezing or shortness of breath. Please dispense one adult spacer for use with MDI.  Marland Kitchen aspirin 81 MG tablet Take 1 tablet by mouth daily.  . Cetirizine HCl (ZYRTEC ALLERGY) 10 MG CAPS Take 1 tablet by mouth daily.   . fluticasone (FLONASE) 50 MCG/ACT nasal spray Place 2 sprays into both nostrils daily.  . Omega-3 Fatty Acids (FISH OIL PO) Take 1 capsule by mouth daily.  Marland Kitchen loratadine (CLARITIN) 10 MG tablet Take 1 tablet by mouth daily as needed.   No facility-administered encounter medications on file as of 10/31/2017.     Activities of Daily Living In your present state of health, do you have any difficulty performing the following activities: 10/31/2017  Hearing? N  Vision? N  Difficulty concentrating or making decisions? N  Walking or climbing stairs? N  Dressing or bathing? N  Doing errands, shopping? N  Preparing Food and eating ? N  Using the Toilet? N  In the past six months, have you accidently leaked urine? N  Do you have problems with loss of bowel control? N  Managing your Medications? N  Managing your Finances? N  Housekeeping or managing your Housekeeping? N  Some recent data might be hidden    Patient Care Team: Birdie Sons, MD as PCP - General (Family Medicine) Melissa Noon, [place] as Referring Physician (Optometry) Brendolyn Patty, MD (Dermatology)   Assessment:   This is a routine wellness examination for [name].  Exercise Activities and Dietary recommendations Current Exercise Habits: The patient does not participate in regular exercise at present, Exercise limited by: None identified  Goals    . Increase water intake     Recommend increasing water intake to 4-6 glasses a day.       Fall Risk Fall Risk  10/31/2017 10/29/2016 09/30/2016 09/30/2014 09/30/2014  Falls in the past year? No No No No No   Is the patient's home free of loose throw rugs in walkways, pet beds, electrical cords, etc?   yes      Grab bars in the bathroom? no      Handrails on the  stairs?   no      Adequate lighting?   yes  Timed Get Up and Go Performed: N/A  Depression Screen PHQ 2/9 Scores 10/31/2017 10/29/2016 09/30/2016 09/30/2014  PHQ - 2 Score 0 0 0 0  PHQ- 9 Score - - - 0    Cognitive Function: Pt declined screening today.         Immunization History  Administered  Date(s) Administered  . Influenza, High Dose Seasonal PF 11/06/2013, 01/06/2015, 12/21/2015, 09/30/2016  . Pneumococcal Conjugate-13 01/06/2015  . Pneumococcal Polysaccharide-23 04/27/2008  . Tdap 05/27/2011  . Zoster 05/27/2011  . Zoster Recombinat (Shingrix) 05/19/2017    Qualifies for Shingles Vaccine? Up to date  Screening Tests Health Maintenance  Topic Date Due  . INFLUENZA VACCINE  09/04/2017  . TETANUS/TDAP  05/26/2021  . PNA vac Low Risk Adult  Completed   Cancer Screenings: Lung: Low Dose CT Chest recommended if Age 28-80 years, 30 pack-year currently smoking OR have quit w/in 15years. Patient does not qualify. Colorectal: Up to date  Additional Screenings:  Hepatitis C Screening: N/A      Plan:  I have personally reviewed and addressed the Medicare Annual Wellness questionnaire and have noted the following in the patient's chart:  A. Medical and social history B. Use of alcohol, tobacco or illicit drugs  C. Current medications and supplements D. Functional ability and status E.  Nutritional status F.  Physical activity G. Advance directives H. List of other physicians I.  Hospitalizations, surgeries, and ER visits in previous 12 months J.  [place] such as hearing and vision if needed, cognitive and depression L. Referrals and appointments - none  In addition, I have reviewed and discussed with patient certain preventive protocols, quality metrics, and best practice recommendations. A written personalized care plan for preventive services as well as general preventive health recommendations were provided to patient.  See attached scanned  questionnaire for additional information.   Signed,  Fabio Neighbors, LPN Nurse Health Advisor   Nurse Recommendations: Pt declined the flu shot today due to not feeling well. Apt scheduled today at 11:20 AM with PCP.

## 2017-10-31 NOTE — Progress Notes (Signed)
Patient: Gregory Arroyo Male    DOB: 01/02/1941   77 y.o.   MRN: [id number] Visit Date: 10/31/2017  Today's Provider: Lelon Huh, MD   Chief Complaint  Patient presents with  . Sinusitis   Subjective:    Sinusitis  This is a new problem. The current episode started in the past 7 days (2 days). The problem has been gradually worsening since onset. There has been no fever. Associated symptoms include congestion, coughing and sinus pressure. Pertinent negatives include no chills, diaphoresis, ear pain, headaches, hoarse voice, neck pain, shortness of breath, sneezing, sore throat or swollen glands. Treatments tried: Mucinex. The treatment provided no relief.    Patient states he has had sinus pressure and ear fullness for 2 days. Patient also has symptoms of nasal congestion, runny nose and dry cough. No fever and no sore throat. Patient states he did take Mucinex-D last night with no relief. Taken Zyrtec up until yesterday.  Is still using fluticasone.    Allergies  Allergen Reactions  . Pollen Extract      Current Outpatient Medications:  .  albuterol (PROVENTIL HFA;VENTOLIN HFA) 108 (90 Base) MCG/ACT inhaler, Inhale 2 puffs into the lungs every 6 (six) hours as needed for wheezing or shortness of breath. Please dispense one adult spacer for use with MDI., Disp: 1 Inhaler, Rfl: 0 .  aspirin 81 MG tablet, Take 1 tablet by mouth daily., Disp: , Rfl:  .  Cetirizine HCl (ZYRTEC ALLERGY) 10 MG CAPS, Take 1 tablet by mouth daily. , Disp: , Rfl:  .  fluticasone (FLONASE) 50 MCG/ACT nasal spray, Place 2 sprays into both nostrils daily., Disp: 16 g, Rfl: 5 .  loratadine (CLARITIN) 10 MG tablet, Take 1 tablet by mouth daily as needed., Disp: , Rfl:  .  Omega-3 Fatty Acids (FISH OIL PO), Take 1 capsule by mouth daily., Disp: , Rfl:   Review of Systems  Constitutional: Negative for appetite change, chills, diaphoresis and fever.  HENT: Positive for congestion, rhinorrhea and sinus  pressure. Negative for ear pain, hoarse voice, sinus pain, sneezing and sore throat.   Respiratory: Positive for cough. Negative for chest tightness, shortness of breath and wheezing.   Cardiovascular: Negative for chest pain and palpitations.  Gastrointestinal: Negative for abdominal pain, nausea and vomiting.  Musculoskeletal: Negative for neck pain.  Neurological: Negative for headaches.    Social History   Tobacco Use  . Smoking status: Former Smoker    Packs/day: 1.00    Years: 2.00    Pack years: 2.00    Types: Cigarettes    Last attempt to quit: 02/04/1958    Years since quitting: 59.7  . Smokeless tobacco: Never Used  Substance Use Topics  . Alcohol use: Yes    Alcohol/week: 1.0 standard drinks    Types: 1 Glasses of wine per week   Objective:   BP 122/75 (BP Location: Right Arm, Patient Position: Sitting, Cuff Size: Large)   Pulse 74   Temp 98.1 F (36.7 C) (Oral)   Resp 16   SpO2 94%     Physical Exam  General Appearance:    Alert, cooperative, no distress  HENT:   bilateral TM normal without fluid or infection, throat normal without erythema or exudate, frontal and maxillary sinus tender and nasal mucosa pale and congested  Eyes:    PERRL, conjunctiva/corneas clear, EOM's intact       Lungs:     Clear to auscultation bilaterally, respirations unlabored  Heart:    Regular rate and rhythm  Neurologic:   Awake, alert, oriented x 3. No apparent focal neurological           defect.           Assessment & Plan:     1. Sinusitis, unspecified chronicity, unspecified location Continue mucinex and fluticasone nasal spray.  - amoxicillin (AMOXIL) 500 MG capsule; Take 2 capsules (1,000 mg total) by mouth 2 (two) times daily for 7 days.  Dispense: 28 capsule; Refill: 0       Lelon Huh, MD  [facility]

## 2017-11-05 ENCOUNTER — Ambulatory Visit (INDEPENDENT_AMBULATORY_CARE_PROVIDER_SITE_OTHER): Payer: Medicare HMO | Admitting: Physician Assistant

## 2017-11-05 ENCOUNTER — Encounter: Payer: Self-pay | Admitting: Physician Assistant

## 2017-11-05 VITALS — BP 118/62 | HR 76 | Temp 98.6°F | Resp 16 | Wt 155.8 lb

## 2017-11-05 DIAGNOSIS — J189 Pneumonia, unspecified organism: Secondary | ICD-10-CM

## 2017-11-05 DIAGNOSIS — J181 Lobar pneumonia, unspecified organism: Secondary | ICD-10-CM

## 2017-11-05 MED ORDER — BENZONATATE 200 MG PO CAPS: 200.0000 mg | ORAL_CAPSULE | Freq: Two times a day (BID) | ORAL | 0 refills | Status: DC | PRN

## 2017-11-05 MED ORDER — LEVOFLOXACIN 500 MG PO TABS: 500.0000 mg | ORAL_TABLET | Freq: Every day | ORAL | 0 refills | Status: DC

## 2017-11-05 MED ORDER — PREDNISONE 10 MG PO TABS: ORAL_TABLET | ORAL | 0 refills | Status: DC

## 2017-11-05 NOTE — Progress Notes (Signed)
Patient: Gregory Arroyo Male    DOB: [birth year]   77 y.o.   MRN: [id number] Visit Date: 11/05/2017  Today's Provider: Mar Daring, PA-C   Chief Complaint  Patient presents with  . Sinusitis   Subjective:    HPI Patient here today with c/o Sinusitis not better. Patient was seen 5 days ago and was treated for sinus infection and was prescribed amoxil 500 mg.Patient reports that he has a lot of draining and coughing a lot. Reports feeling very tired no energy. Tried Advil.     Allergies  Allergen Reactions  . Pollen Extract      Current Outpatient Medications:  .  amoxicillin (AMOXIL) 500 MG capsule, Take 2 capsules (1,000 mg total) by mouth 2 (two) times daily for 7 days., Disp: 28 capsule, Rfl: 0 .  aspirin 81 MG tablet, Take 1 tablet by mouth daily., Disp: , Rfl:  .  Cetirizine HCl (ZYRTEC ALLERGY) 10 MG CAPS, Take 1 tablet by mouth daily. , Disp: , Rfl:  .  fluticasone (FLONASE) 50 MCG/ACT nasal spray, Place 2 sprays into both nostrils daily., Disp: 16 g, Rfl: 5 .  loratadine (CLARITIN) 10 MG tablet, Take 1 tablet by mouth daily as needed., Disp: , Rfl:  .  Omega-3 Fatty Acids (FISH OIL PO), Take 1 capsule by mouth daily., Disp: , Rfl:   Review of Systems  Constitutional: Positive for chills and fatigue. Negative for fever.  HENT: Positive for congestion, postnasal drip, sinus pressure and sore throat. Negative for sinus pain.   Respiratory: Positive for cough, chest tightness and shortness of breath. Negative for wheezing.   Cardiovascular: Negative for chest pain, palpitations and leg swelling.  Neurological: Negative.     Social History   Tobacco Use  . Smoking status: Former Smoker    Packs/day: 1.00    Years: 2.00    Pack years: 2.00    Types: Cigarettes    Last attempt to quit: 02/04/1958    Years since quitting: 59.7  . Smokeless tobacco: Never Used  Substance Use Topics  . Alcohol use: Yes    Alcohol/week: 1.0 standard drinks    Types: 1  Glasses of wine per week   Objective:   BP 118/62 (BP Location: Left Arm, Patient Position: Sitting, Cuff Size: Normal)   Pulse 76   Temp 98.6 F (37 C) (Oral)   Resp 16   Wt 155 lb 12.8 oz (70.7 kg)   SpO2 96%   BMI 23.69 kg/m  Vitals:   11/05/17 1422  BP: 118/62  Pulse: 76  Resp: 16  Temp: 98.6 F (37 C)  TempSrc: Oral  SpO2: 96%  Weight: 155 lb 12.8 oz (70.7 kg)     Physical Exam  Constitutional: He appears well-developed and well-nourished. No distress.  HENT:  Head: Normocephalic and atraumatic.  Right Ear: Hearing, external ear and ear canal normal. Tympanic membrane is not erythematous and not bulging. A middle ear effusion is present.  Left Ear: Hearing, external ear and ear canal normal. Tympanic membrane is not erythematous and not bulging. A middle ear effusion is present.  Nose: Mucosal edema and rhinorrhea present. Right sinus exhibits maxillary sinus tenderness. Right sinus exhibits no frontal sinus tenderness. Left sinus exhibits maxillary sinus tenderness. Left sinus exhibits no frontal sinus tenderness.  Mouth/Throat: Uvula is midline, oropharynx is clear and moist and mucous membranes are normal. No oropharyngeal exudate, posterior oropharyngeal edema or posterior oropharyngeal erythema.  Eyes: Pupils are  equal, round, and reactive to light. Conjunctivae and EOM are normal. Right eye exhibits no discharge. Left eye exhibits no discharge.  Neck: Normal range of motion. Neck supple. No tracheal deviation present. No Brudzinski's sign and no Kernig's sign noted. No thyromegaly present.  Cardiovascular: Normal rate, regular rhythm and normal heart sounds. Exam reveals no gallop and no friction rub.  No murmur heard. Pulmonary/Chest: Effort normal. No stridor. No respiratory distress. He has no wheezes. He has rales in the left upper field and the left middle field.  Lymphadenopathy:    He has no cervical adenopathy.  Skin: Skin is warm and dry. He is not  diaphoretic.  Vitals reviewed.       Assessment & Plan:     1. Pneumonia of left upper lobe due to infectious organism ([place]) Adventitious lung sounds highly suspicious for pneumonia. He declines CXR. Will stop amoxil. Start levaquin and prednisone as below. Tessalon for cough. Continue zyrtec and flonase. Call if symptoms worsen.  - levofloxacin (LEVAQUIN) 500 MG tablet; Take 1 tablet (500 mg total) by mouth daily.  Dispense: 7 tablet; Refill: 0 - predniSONE (DELTASONE) 10 MG tablet; Take 6 tabs PO on day 1&2, 5 tabs PO on day 3&4, 4 tabs PO on day 5&6, 3 tabs PO on day 7&8, 2 tabs PO on day 9&10, 1 tab PO on day 11&12.  Dispense: 42 tablet; Refill: 0 - benzonatate (TESSALON) 200 MG capsule; Take 1 capsule (200 mg total) by mouth 2 (two) times daily as needed for cough.  Dispense: 20 capsule; Refill: 0       Mar Daring, PA-C  [facility]

## 2017-11-17 ENCOUNTER — Encounter: Payer: Self-pay | Admitting: Family Medicine

## 2017-11-17 ENCOUNTER — Ambulatory Visit (INDEPENDENT_AMBULATORY_CARE_PROVIDER_SITE_OTHER): Payer: Medicare HMO | Admitting: Family Medicine

## 2017-11-17 VITALS — BP 96/58 | HR 79 | Temp 98.6°F | Resp 18 | Ht 68.0 in | Wt 154.0 lb

## 2017-11-17 DIAGNOSIS — R7303 Prediabetes: Secondary | ICD-10-CM | POA: Diagnosis not present

## 2017-11-17 DIAGNOSIS — Z125 Encounter for screening for malignant neoplasm of prostate: Secondary | ICD-10-CM | POA: Diagnosis not present

## 2017-11-17 DIAGNOSIS — Z0000 Encounter for general adult medical examination without abnormal findings: Secondary | ICD-10-CM | POA: Diagnosis not present

## 2017-11-17 DIAGNOSIS — E781 Pure hyperglyceridemia: Secondary | ICD-10-CM

## 2017-11-17 NOTE — Progress Notes (Signed)
Patient: Gregory Arroyo, Male    DOB: 01-25-1941, 77 y.o.   MRN: [id number] Visit Date: 11/17/2017  Today's Provider: Lelon Huh, MD   Chief Complaint  Patient presents with  . Annual Exam  . Pneumonia   Subjective:     Complete Physical Gregory Arroyo is a 77 y.o. male. He feels fairly well. He reports exercising daily. He reports he is sleeping fairly well.  ----------------------------------------------------------- Follow up of Pneumonia: Patient was last seen for this problem 12 days ago. Patient was treated with Levaquin 500mg  for 7 days. He was also prescribed a 6 day taper of Prednisone 10mg  along with Tessalon perles as needed. Today, patient comes in reporting good compliance with treatment, good tolerance and fair symptom control. He states he still has a productive cough, wheezing and shortness of breath.   Review of Systems  Constitutional: Positive for fatigue. Negative for appetite change, chills and fever.  HENT: Negative for congestion, ear pain, hearing loss, nosebleeds and trouble swallowing.   Eyes: Negative for pain and visual disturbance.  Respiratory: Positive for cough (productive with yellow mucus), shortness of breath and wheezing. Negative for chest tightness.   Cardiovascular: Negative for chest pain, palpitations and leg swelling.  Gastrointestinal: Negative for abdominal pain, blood in stool, constipation, diarrhea, nausea and vomiting.  Endocrine: Negative for polydipsia, polyphagia and polyuria.  Genitourinary: Negative for dysuria and flank pain.  Musculoskeletal: Negative for arthralgias, back pain, joint swelling, myalgias and neck stiffness.  Skin: Negative for color change, rash and wound.  Neurological: Negative for dizziness, tremors, seizures, speech difficulty, weakness, light-headedness and headaches.  Psychiatric/Behavioral: Negative for behavioral problems, confusion, decreased concentration, dysphoric mood and sleep  disturbance. The patient is not nervous/anxious.   All other systems reviewed and are negative.   Social History   Socioeconomic History  . Marital status: Married    Spouse name: Not on file  . Number of children: 4  . Years of education: Not on file  . Highest education level: Associate degree: occupational, Hotel manager, or vocational program  Occupational History  . 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 Financial resource strain: Not hard at all  . Food insecurity:    Worry: Never true    Inability: Never true  . Transportation needs:    Medical: No    Non-medical: No  Tobacco Use  . Smoking status: Former Smoker    Packs/day: 1.00    Years: 2.00    Pack years: 2.00    Types: Cigarettes    Last attempt to quit: 02/04/1958    Years since quitting: 59.8  . Smokeless tobacco: Never Used  Substance and Sexual Activity  . Alcohol use: Yes    Alcohol/week: 1.0 standard drinks    Types: 1 Glasses of wine per week  . Drug use: No  . Sexual activity: Not on file  Lifestyle  . Physical activity:    Days per week: Not on file    Minutes per session: Not on file  . Stress: Not at all  Relationships  . Social connections:    Talks on phone: Not on file    Gets together: Not on file    Attends religious service: Not on file    Active member of club or organization: Not on file    Attends meetings of clubs or organizations: Not on file  Relationship status: Not on file  . Intimate partner violence:    Fear of current or ex partner: Not on file    Emotionally abused: Not on file    Physically abused: Not on file    Forced sexual activity: Not on file  Other Topics Concern  . Not on file  Social History Narrative  . Not on file    Past Medical History:  Diagnosis Date  . History of chicken pox   . History of measles      Patient Active Problem List    Diagnosis Date Noted  . Reactive airway disease 05/08/2015  . H/O adenomatous polyp of colon 08/29/2014  . Pre-diabetes 08/29/2014  . Skin lesion of face 08/29/2014  . Vertigo 08/29/2014  . External hemorrhoids without complication 43/32/9518  . Testicular hypofunction 05/06/2008  . Family history of colon cancer 04/28/2008  . Allergic rhinitis 04/27/2008  . Decreased libido 04/27/2008    Past Surgical History:  Procedure Laterality Date  . CATARACT EXTRACTION Right 04/2011   Dr. Martie Round, [place]  . HERNIA REPAIR Right 2010   Dr. Bary Castilla; Inguinal Hernia  . [place]  2001  . SHOULDER SURGERY Left 2009   Dr. Sabra Heck    His family history includes Colon cancer (age of onset: 73) in his sister; Heart attack in his father; Heart disease in his father and mother; Stroke in his mother; Transient ischemic attack in his mother.      Current Outpatient Medications:  .  aspirin 81 MG tablet, Take 1 tablet by mouth daily., Disp: , Rfl:  .  Cetirizine HCl (ZYRTEC ALLERGY) 10 MG CAPS, Take 1 tablet by mouth daily. , Disp: , Rfl:  .  fluticasone (FLONASE) 50 MCG/ACT nasal spray, Place 2 sprays into both nostrils daily., Disp: 16 g, Rfl: 5 .  loratadine (CLARITIN) 10 MG tablet, Take 1 tablet by mouth daily as needed., Disp: , Rfl:  .  Omega-3 Fatty Acids (FISH OIL PO), Take 1 capsule by mouth daily., Disp: , Rfl:  .  benzonatate (TESSALON) 200 MG capsule, Take 1 capsule (200 mg total) by mouth 2 (two) times daily as needed for cough. (Patient not taking: Reported on 11/17/2017), Disp: 20 capsule, Rfl: 0 .  levofloxacin (LEVAQUIN) 500 MG tablet, Take 1 tablet (500 mg total) by mouth daily. (Patient not taking: Reported on 11/17/2017), Disp: 7 tablet, Rfl: 0 .  predniSONE (DELTASONE) 10 MG tablet, Take 6 tabs PO on day 1&2, 5 tabs PO on day 3&4, 4 tabs PO on day 5&6, 3 tabs PO on day 7&8, 2 tabs PO on day 9&10, 1 tab PO on day 11&12. (Patient not taking:  Reported on 11/17/2017), Disp: 42 tablet, Rfl: 0  Patient Care Team: Birdie Sons, MD as PCP - General (Family Medicine) Melissa Noon, [place] as Referring Physician (Optometry) Brendolyn Patty, MD (Dermatology)     Objective:   Vitals: BP (!) 96/58 (BP Location: Left Arm, Cuff Size: Normal)   Pulse 79   Temp 98.6 F (37 C) (Oral)   Resp 18   Ht 5\' 8"  (1.727 m)   Wt 154 lb (69.9 kg)   SpO2 97% Comment: room air  BMI 23.42 kg/m   Physical Exam   General Appearance:    Alert, cooperative, no distress, appears stated age  Head:    Normocephalic, without obvious abnormality, atraumatic  Eyes:    PERRL, conjunctiva/corneas clear, EOM's intact, fundi  benign, both eyes       Ears:    Normal TM's and external ear canals, both ears  Nose:   Nares normal, septum midline, mucosa normal, no drainage   or sinus tenderness  Throat:   Lips, mucosa, and tongue normal; teeth and gums normal  Neck:   Supple, symmetrical, trachea midline, no adenopathy;       thyroid:  No enlargement/tenderness/nodules; no carotid   bruit or JVD  Back:     Symmetric, no curvature, ROM normal, no CVA tenderness  Lungs:     Clear to auscultation bilaterally, respirations unlabored  Chest wall:    No tenderness or deformity  Heart:    Regular rate and rhythm, S1 and S2 normal, no murmur, rub   or gallop  Abdomen:     Soft, non-tender, bowel sounds active all four quadrants,    no masses, no organomegaly  Genitalia:    deferred  Rectal:    deferred  Extremities:   Extremities normal, atraumatic, no cyanosis or edema  Pulses:   2+ and symmetric all extremities  Skin:   Skin color, texture, turgor normal, no rashes or lesions  Lymph nodes:   Cervical, supraclavicular, and axillary nodes normal  Neurologic:   CNII-XII intact. Normal strength, sensation and reflexes      throughout   Activities of Daily Living In your present state of health, do you have any difficulty performing the following activities:  10/31/2017  Hearing? N  Vision? N  Difficulty concentrating or making decisions? N  Walking or climbing stairs? N  Dressing or bathing? N  Doing errands, shopping? N  Preparing Food and eating ? N  Using the Toilet? N  In the past six months, have you accidently leaked urine? N  Do you have problems with loss of bowel control? N  Managing your Medications? N  Managing your Finances? N  Housekeeping or managing your Housekeeping? N  Some recent data might be hidden    Fall Risk Assessment Fall Risk  10/31/2017 10/29/2016 09/30/2016 09/30/2014 09/30/2014  Falls in the past year? No No No No No     Depression Screen PHQ 2/9 Scores 10/31/2017 10/29/2016 09/30/2016 09/30/2014  PHQ - 2 Score 0 0 0 0  PHQ- 9 Score - - - 0    Cognitive Testing - 6-CIT-declined      Assessment & Plan:    Annual Physical Reviewed patient's Family Medical History Reviewed and updated list of patient's medical providers Assessment of cognitive impairment was done Assessed patient's functional ability Established a written schedule for health screening [place] Completed and Reviewed  Exercise Activities and Dietary recommendations Goals    . Increase water intake     Recommend increasing water intake to 4-6 glasses a day.       Immunization History  Administered Date(s) Administered  . Influenza, High Dose Seasonal PF 11/06/2013, 01/06/2015, 12/21/2015, 09/30/2016, 10/31/2017  . Pneumococcal Conjugate-13 01/06/2015  . Pneumococcal Polysaccharide-23 04/27/2008  . Tdap 05/27/2011  . Zoster 05/27/2011  . Zoster Recombinat (Shingrix) 05/19/2017    Health Maintenance  Topic Date Due  . Samul Dada  05/26/2021  . INFLUENZA VACCINE  Completed  . PNA vac Low Risk Adult  Completed     Discussed health benefits of physical activity, and encouraged him to engage in regular exercise appropriate for his age and condition.      ------------------------------------------------------------------------------------------------------------  1. Annual physical exam Doing well, normal exam.   2. Pre-diabetes  -  Hemoglobin A1c  3. Prostate cancer screening  - PSA  4. Hypertriglyceridemia On OTC fish oil supplements.  - Lipid panel   Lelon Huh, MD  [facility]

## 2017-11-18 LAB — HEMOGLOBIN A1C
ESTIMATED AVERAGE GLUCOSE: 128 mg/dL
Hgb A1c MFr Bld: 6.1 % — ABNORMAL HIGH (ref 4.8–5.6)

## 2017-11-18 LAB — LIPID PANEL
CHOLESTEROL TOTAL: 200 mg/dL — AB (ref 100–199)
Chol/HDL Ratio: 5.1 ratio — ABNORMAL HIGH (ref 0.0–5.0)
HDL: 39 mg/dL — ABNORMAL LOW (ref >39)
LDL CALC: 96 mg/dL (ref 0–99)
Triglycerides: 327 mg/dL — ABNORMAL HIGH (ref 0–149)
VLDL Cholesterol Cal: 65 mg/dL — ABNORMAL HIGH (ref 5–40)

## 2017-11-18 LAB — PSA: Prostate Specific Ag, Serum: 0.5 ng/mL (ref 0.0–4.0)

## 2017-12-03 DIAGNOSIS — R69 Illness, unspecified: Secondary | ICD-10-CM | POA: Diagnosis not present

## 2017-12-09 DIAGNOSIS — Z1283 Encounter for screening for malignant neoplasm of skin: Secondary | ICD-10-CM | POA: Diagnosis not present

## 2017-12-09 DIAGNOSIS — D1800 Hemangioma unspecified site: Secondary | ICD-10-CM | POA: Diagnosis not present

## 2017-12-09 DIAGNOSIS — L821 Other seborrheic keratosis: Secondary | ICD-10-CM | POA: Diagnosis not present

## 2017-12-09 DIAGNOSIS — L820 Inflamed seborrheic keratosis: Secondary | ICD-10-CM | POA: Diagnosis not present

## 2017-12-09 DIAGNOSIS — Z85828 Personal history of other malignant neoplasm of skin: Secondary | ICD-10-CM | POA: Diagnosis not present

## 2017-12-09 DIAGNOSIS — L812 Freckles: Secondary | ICD-10-CM | POA: Diagnosis not present

## 2017-12-09 DIAGNOSIS — L570 Actinic keratosis: Secondary | ICD-10-CM | POA: Diagnosis not present

## 2018-01-23 ENCOUNTER — Ambulatory Visit (INDEPENDENT_AMBULATORY_CARE_PROVIDER_SITE_OTHER): Payer: Medicare HMO | Admitting: Family Medicine

## 2018-01-23 ENCOUNTER — Telehealth: Payer: Self-pay

## 2018-01-23 ENCOUNTER — Ambulatory Visit
Admission: RE | Admit: 2018-01-23 | Discharge: 2018-01-23 | Disposition: A | Discharge status: Home / Self Care | Payer: Medicare HMO | Source: Ambulatory Visit | Attending: Family Medicine | Admitting: Family Medicine

## 2018-01-23 ENCOUNTER — Ambulatory Visit
Admission: RE | Admit: 2018-01-23 | Discharge: 2018-01-23 | Disposition: A | Discharge status: Home / Self Care | Payer: Medicare HMO | Attending: Family Medicine | Admitting: Family Medicine

## 2018-01-23 ENCOUNTER — Encounter: Payer: Self-pay | Admitting: Family Medicine

## 2018-01-23 VITALS — BP 132/80 | HR 65 | Temp 97.8°F | Resp 16 | Wt 152.0 lb

## 2018-01-23 DIAGNOSIS — R0602 Shortness of breath: Secondary | ICD-10-CM | POA: Insufficient documentation

## 2018-01-23 DIAGNOSIS — R05 Cough: Secondary | ICD-10-CM

## 2018-01-23 DIAGNOSIS — J40 Bronchitis, not specified as acute or chronic: Secondary | ICD-10-CM | POA: Diagnosis not present

## 2018-01-23 DIAGNOSIS — R059 Cough, unspecified: Secondary | ICD-10-CM

## 2018-01-23 MED ORDER — AZITHROMYCIN 250 MG PO TABS: ORAL_TABLET | ORAL | 0 refills | Status: AC

## 2018-01-23 NOTE — Telephone Encounter (Signed)
Advised patient of results.  

## 2018-01-23 NOTE — Telephone Encounter (Signed)
LMTCB 01/23/2018  Thanks,   -Mickel Baas

## 2018-01-23 NOTE — Patient Instructions (Signed)
.   PLEASE BRING ALL OF YOUR MEDICATIONS TO EVERY APPOINTMENT TO MAKE SURE OUR MEDICATION LIST IS THE SAME AS YOURS   

## 2018-01-23 NOTE — Telephone Encounter (Signed)
Pt returning missed call. ° °Please call pt back. ° °Thanks, °[facility] °

## 2018-01-23 NOTE — Telephone Encounter (Signed)
-----   Message from Birdie Sons, MD sent at 01/23/2018  1:12 PM EST ----- Chest xr is clear. He likely has mild bronchitis. Have sent prescription for Zpack to [place]. Call if not better over the weekend.

## 2018-01-23 NOTE — Progress Notes (Signed)
Patient: Gregory Arroyo Male    DOB: [birth year]   77 y.o.   MRN: [id number] Visit Date: 01/23/2018  Today's Provider: Lelon Huh, MD   Chief Complaint  Patient presents with  . Cough    x 10 days   Subjective:     Cough  This is a new problem. Episode onset: 10 days ago. The problem has been unchanged. The cough is non-productive. Associated symptoms include postnasal drip, rhinorrhea, a sore throat, shortness of breath and sweats (at night). Pertinent negatives include no chills, ear congestion, ear pain, fever, headaches, hemoptysis, myalgias, nasal congestion or wheezing. Treatments tried: Mucinex D. The treatment provided no relief.   BP Readings from Last 3 Encounters:  01/23/18 132/80  11/17/17 (!) 96/58  11/05/17 118/62   Wt Readings from Last 3 Encounters:  01/23/18 152 lb (68.9 kg)  11/17/17 154 lb (69.9 kg)  11/05/17 155 lb 12.8 oz (70.7 kg)    This SmartLink has not been configured with any valid records.   Allergies  Allergen Reactions  . Pollen Extract      Current Outpatient Medications:  .  aspirin 81 MG tablet, Take 1 tablet by mouth daily., Disp: , Rfl:  .  Cetirizine HCl (ZYRTEC ALLERGY) 10 MG CAPS, Take 1 tablet by mouth daily. , Disp: , Rfl:  .  fluticasone (FLONASE) 50 MCG/ACT nasal spray, Place 2 sprays into both nostrils daily., Disp: 16 g, Rfl: 5 .  loratadine (CLARITIN) 10 MG tablet, Take 1 tablet by mouth daily as needed., Disp: , Rfl:  .  Omega-3 Fatty Acids (FISH OIL PO), Take 1 capsule by mouth daily., Disp: , Rfl:   Review of Systems  Constitutional: Negative for chills and fever.  HENT: Positive for postnasal drip, rhinorrhea and sore throat. Negative for ear pain.   Respiratory: Positive for cough and shortness of breath. Negative for hemoptysis and wheezing.   Musculoskeletal: Negative for myalgias.  Neurological: Negative for headaches.    Social History   Tobacco Use  . Smoking status: Former Smoker    Packs/day:  1.00    Years: 2.00    Pack years: 2.00    Types: Cigarettes    Last attempt to quit: 02/04/1958    Years since quitting: 60.0  . Smokeless tobacco: Never Used  Substance Use Topics  . Alcohol use: Yes    Alcohol/week: 1.0 standard drinks    Types: 1 Glasses of wine per week      Objective:   BP 132/80 (BP Location: Right Arm, Cuff Size: Normal)   Pulse 65   Temp 97.8 F (36.6 C) (Oral)   Resp 16   Wt 152 lb (68.9 kg)   SpO2 94% Comment: room air  BMI 23.11 kg/m  Vitals:   01/23/18 1032 01/23/18 1034  BP: (!) 144/90 132/80  Pulse: 65   Resp: 16   Temp: 97.8 F (36.6 C)   TempSrc: Oral   SpO2: 94%   Weight: 152 lb (68.9 kg)      Physical Exam  General Appearance:    Alert, cooperative, no distress  HENT:   ENT exam normal, no neck nodes or sinus tenderness  Eyes:    PERRL, conjunctiva/corneas clear, EOM's intact       Lungs:     Occasional expiratory wheeze, no rales, , respirations unlabored  Heart:    Regular rate and rhythm  Neurologic:   Awake, alert, oriented x 3. No apparent focal neurological  defect.      CXR Negative.     Assessment & Plan    1. Cough  - DG Chest 2 View; Future  2. Shortness of breath  - DG Chest 2 View; Future  3. Bronchitis  - azithromycin (ZITHROMAX) 250 MG tablet; 2 by mouth today, then 1 daily for 4 days  Dispense: 6 tablet; Refill: 0  Call if symptoms change or if not rapidly improving.      Lelon Huh, MD  [facility]

## 2018-01-26 ENCOUNTER — Telehealth: Payer: Self-pay

## 2018-01-26 NOTE — Telephone Encounter (Signed)
Patient was seen on 01/23/2018. He states he was told if he did not feel any better by this morning to call back. He is still coughing persistently. He is requesting a call back for further recommendations. CB# [phone]

## 2018-01-26 NOTE — Telephone Encounter (Signed)
Usually take 3-4 days to improve on azithromycin. If still not better can change to doxycycline 100mg  twice a day for 7 days.

## 2018-01-26 NOTE — Telephone Encounter (Signed)
Tried calling patient. Left message to call back. 

## 2018-01-29 NOTE — Telephone Encounter (Signed)
LMTCB 01/29/2018  Thanks,   -Mickel Baas

## 2018-02-02 NOTE — Telephone Encounter (Signed)
lmtcb

## 2018-02-03 NOTE — Telephone Encounter (Signed)
Patient has not returned call after 3 attempts. Will save message to chart.

## 2018-03-06 DIAGNOSIS — H04123 Dry eye syndrome of bilateral lacrimal glands: Secondary | ICD-10-CM | POA: Diagnosis not present

## 2018-03-14 ENCOUNTER — Ambulatory Visit (INDEPENDENT_AMBULATORY_CARE_PROVIDER_SITE_OTHER): Payer: Medicare HMO | Admitting: Physician Assistant

## 2018-03-14 ENCOUNTER — Encounter: Payer: Self-pay | Admitting: Physician Assistant

## 2018-03-14 VITALS — BP 126/77 | HR 52 | Temp 97.8°F | Resp 16 | Wt 154.0 lb

## 2018-03-14 DIAGNOSIS — R05 Cough: Secondary | ICD-10-CM

## 2018-03-14 DIAGNOSIS — J0110 Acute frontal sinusitis, unspecified: Secondary | ICD-10-CM

## 2018-03-14 DIAGNOSIS — R059 Cough, unspecified: Secondary | ICD-10-CM

## 2018-03-14 MED ORDER — DOXYCYCLINE HYCLATE 100 MG PO TABS: 100.0000 mg | ORAL_TABLET | Freq: Two times a day (BID) | ORAL | 0 refills | Status: AC

## 2018-03-14 MED ORDER — PROMETHAZINE-DM 6.25-15 MG/5ML PO SYRP: 5.0000 mL | ORAL_SOLUTION | Freq: Every evening | ORAL | 0 refills | Status: DC | PRN

## 2018-03-14 NOTE — Progress Notes (Signed)
Patient: Gregory Arroyo Male    DOB: [birth year]   78 y.o.   MRN: [id number] Visit Date: 03/14/2018  Today's Provider: Trinna Post, PA-C   Chief Complaint  Patient presents with  . Sinusitis   Subjective:     Sinusitis  This is a new problem. The current episode started in the past 7 days (3 days). The problem has been gradually worsening since onset. There has been no fever. Associated symptoms include congestion, coughing, diaphoresis, sinus pressure, sneezing and a sore throat. Pertinent negatives include no chills, ear pain, headaches, hoarse voice, neck pain, shortness of breath or swollen glands. Treatments tried: Mucinex and Ibuprofen. The treatment provided mild relief.   Patient has had sinus pressure and pain for 3 days. Patient alsohas symptoms of cough, sweats, sore throat, ear congestion, and sneezing. Patient has been otc Mucinex and Ibuprofen with mild relief.   Allergies  Allergen Reactions  . Pollen Extract      Current Outpatient Medications:  .  aspirin 81 MG tablet, Take 1 tablet by mouth daily., Disp: , Rfl:  .  Cetirizine HCl (ZYRTEC ALLERGY) 10 MG CAPS, Take 1 tablet by mouth daily. , Disp: , Rfl:  .  fluticasone (FLONASE) 50 MCG/ACT nasal spray, Place 2 sprays into both nostrils daily., Disp: 16 g, Rfl: 5 .  loratadine (CLARITIN) 10 MG tablet, Take 1 tablet by mouth daily as needed., Disp: , Rfl:  .  Omega-3 Fatty Acids (FISH OIL PO), Take 1 capsule by mouth daily., Disp: , Rfl:  .  doxycycline (VIBRA-TABS) 100 MG tablet, Take 1 tablet (100 mg total) by mouth 2 (two) times daily for 7 days., Disp: 14 tablet, Rfl: 0 .  promethazine-dextromethorphan (PROMETHAZINE-DM) 6.25-15 MG/5ML syrup, Take 5 mLs by mouth at bedtime as needed for cough., Disp: 118 mL, Rfl: 0  Review of Systems  Constitutional: Positive for diaphoresis. Negative for appetite change, chills and fever.  HENT: Positive for congestion, sinus pressure, sneezing and sore throat.  Negative for ear pain and hoarse voice.   Respiratory: Positive for cough. Negative for chest tightness, shortness of breath and wheezing.   Cardiovascular: Negative for chest pain and palpitations.  Gastrointestinal: Negative for abdominal pain, nausea and vomiting.  Musculoskeletal: Negative for neck pain.  Neurological: Negative for headaches.    Social History   Tobacco Use  . Smoking status: Former Smoker    Packs/day: 1.00    Years: 2.00    Pack years: 2.00    Types: Cigarettes    Last attempt to quit: 02/04/1958    Years since quitting: 60.1  . Smokeless tobacco: Never Used  Substance Use Topics  . Alcohol use: Yes    Alcohol/week: 1.0 standard drinks    Types: 1 Glasses of wine per week      Objective:   BP 126/77 (BP Location: Right Arm, Patient Position: Sitting, Cuff Size: Large)   Pulse (!) 52   Temp 97.8 F (36.6 C) (Oral)   Resp 16   Wt 154 lb (69.9 kg)   SpO2 93%   BMI 23.42 kg/m  Vitals:   03/14/18 0940  BP: 126/77  Pulse: (!) 52  Resp: 16  Temp: 97.8 F (36.6 C)  TempSrc: Oral  SpO2: 93%  Weight: 154 lb (69.9 kg)     Physical Exam Constitutional:      General: He is not in acute distress.    Appearance: He is well-developed. He is not diaphoretic.  HENT:  Right Ear: Tympanic membrane and external ear normal.     Left Ear: Tympanic membrane and external ear normal.     Nose: Rhinorrhea present.     Right Sinus: No maxillary sinus tenderness or frontal sinus tenderness.     Left Sinus: No maxillary sinus tenderness or frontal sinus tenderness.     Mouth/Throat:     Pharynx: Uvula midline. No oropharyngeal exudate.  Eyes:     General:        Right eye: Discharge present.        Left eye: Discharge present.    Conjunctiva/sclera: Conjunctivae normal.     Comments: Watery Discharge   Neck:     Musculoskeletal: Normal range of motion and neck supple.  Cardiovascular:     Rate and Rhythm: Normal rate and regular rhythm.  Pulmonary:      Effort: Pulmonary effort is normal. No respiratory distress.     Breath sounds: Normal breath sounds. No wheezing or rales.  Lymphadenopathy:     Cervical: No cervical adenopathy.  Skin:    General: Skin is warm and dry.  Neurological:     Mental Status: He is alert and oriented to person, place, and time.  Psychiatric:        Mood and Affect: Mood normal.        Behavior: Behavior normal.         Assessment & Plan    1. Acute non-recurrent frontal sinusitis  Counseled that this is viral and he should continue using allegra, mucinex, and cough medication as below for SEVEN DAYS before filling antibiotic, which he requested to have on hand.  - doxycycline (VIBRA-TABS) 100 MG tablet; Take 1 tablet (100 mg total) by mouth 2 (two) times daily for 7 days.  Dispense: 14 tablet; Refill: 0 - promethazine-dextromethorphan (PROMETHAZINE-DM) 6.25-15 MG/5ML syrup; Take 5 mLs by mouth at bedtime as needed for cough.  Dispense: 118 mL; Refill: 0  2. Cough  - promethazine-dextromethorphan (PROMETHAZINE-DM) 6.25-15 MG/5ML syrup; Take 5 mLs by mouth at bedtime as needed for cough.  Dispense: 118 mL; Refill: 0  The entirety of the information documented in the History of Present Illness, Review of Systems and Physical Exam were personally obtained by me. Portions of this information were initially documented by April M. Sabra Heck, CMA and reviewed by me for thoroughness and accuracy.   Return if symptoms worsen or fail to improve.     Trinna Post, PA-C  [facility]

## 2018-03-14 NOTE — Patient Instructions (Signed)

## 2018-03-20 ENCOUNTER — Encounter: Payer: Self-pay | Admitting: Family Medicine

## 2018-03-20 ENCOUNTER — Ambulatory Visit (INDEPENDENT_AMBULATORY_CARE_PROVIDER_SITE_OTHER): Payer: Medicare HMO | Admitting: Family Medicine

## 2018-03-20 VITALS — BP 120/60 | HR 86 | Temp 99.6°F | Resp 18 | Wt 153.0 lb

## 2018-03-20 DIAGNOSIS — R05 Cough: Secondary | ICD-10-CM | POA: Diagnosis not present

## 2018-03-20 DIAGNOSIS — J0110 Acute frontal sinusitis, unspecified: Secondary | ICD-10-CM

## 2018-03-20 DIAGNOSIS — R059 Cough, unspecified: Secondary | ICD-10-CM

## 2018-03-20 MED ORDER — HYDROCODONE-HOMATROPINE 5-1.5 MG/5ML PO SYRP: 5.0000 mL | ORAL_SOLUTION | Freq: Three times a day (TID) | ORAL | 0 refills | Status: DC | PRN

## 2018-03-20 NOTE — Progress Notes (Signed)
Patient: Gregory Arroyo Male    DOB: Sep 15, 1940   78 y.o.   MRN: [id number] Visit Date: 03/20/2018  Today's Provider: Lelon Huh, MD   Chief Complaint  Patient presents with  . URI   Subjective:     URI   This is a new problem. The current episode started 1 to 4 weeks ago (Onset: 2 weeks ago. Patient was seen in the office by Marton Redwood, PA-C on 03/14/2018 for sinusitis and cough. Patient was treated with Doxycycline and given a prescription for Promethazine-DM.). The problem has been gradually worsening (Patient states he never started the Antibiotic, and the cough medication didnt help). There has been no fever. Associated symptoms include congestion (head congestion), coughing (productive with yellow sputum), rhinorrhea, sneezing, a sore throat and swollen glands. Pertinent negatives include no abdominal pain, chest pain, nausea, neck pain, vomiting or wheezing.    Allergies  Allergen Reactions  . Pollen Extract      Current Outpatient Medications:  .  aspirin 81 MG tablet, Take 1 tablet by mouth daily., Disp: , Rfl:  .  Cetirizine HCl (ZYRTEC ALLERGY) 10 MG CAPS, Take 1 tablet by mouth daily. , Disp: , Rfl:  .  fluticasone (FLONASE) 50 MCG/ACT nasal spray, Place 2 sprays into both nostrils daily., Disp: 16 g, Rfl: 5 .  loratadine (CLARITIN) 10 MG tablet, Take 1 tablet by mouth daily as needed., Disp: , Rfl:  .  Omega-3 Fatty Acids (FISH OIL PO), Take 1 capsule by mouth daily., Disp: , Rfl:  .  promethazine-dextromethorphan (PROMETHAZINE-DM) 6.25-15 MG/5ML syrup, Take 5 mLs by mouth at bedtime as needed for cough., Disp: 118 mL, Rfl: 0 .  doxycycline (VIBRA-TABS) 100 MG tablet, Take 1 tablet (100 mg total) by mouth 2 (two) times daily for 7 days. (Patient not taking: Reported on 03/20/2018), Disp: 14 tablet, Rfl: 0  Review of Systems  Constitutional: Positive for fatigue. Negative for appetite change, chills and fever.  HENT: Positive for congestion (head  congestion), postnasal drip, rhinorrhea, sinus pressure, sneezing and sore throat.   Respiratory: Positive for cough (productive with yellow sputum). Negative for chest tightness, shortness of breath and wheezing.   Cardiovascular: Negative for chest pain and palpitations.  Gastrointestinal: Negative for abdominal pain, nausea and vomiting.  Musculoskeletal: Negative for neck pain.    Social History   Tobacco Use  . Smoking status: Former Smoker    Packs/day: 1.00    Years: 2.00    Pack years: 2.00    Types: Cigarettes    Last attempt to quit: 02/04/1958    Years since quitting: 60.1  . Smokeless tobacco: Never Used  Substance Use Topics  . Alcohol use: Yes    Alcohol/week: 1.0 standard drinks    Types: 1 Glasses of wine per week      Objective:   BP 120/60 (BP Location: Left Arm, Cuff Size: Normal)   Pulse 86   Temp 99.6 F (37.6 C) (Oral)   Resp 18   Wt 153 lb (69.4 kg)   SpO2 93% Comment: room air  BMI 23.26 kg/m  Vitals:   03/20/18 1602 03/20/18 1604  BP:  120/60  Pulse: 86   Resp: 18   Temp: 99.6 F (37.6 C)   TempSrc: Oral   SpO2: 93%   Weight: 153 lb (69.4 kg)      Physical Exam  General Appearance:    Alert, cooperative, no distress  HENT:   bilateral TM normal without  fluid or infection, neck without nodes, throat normal without erythema or exudate, frontal and maxillary sinus tender and nasal mucosa pale and congested  Eyes:    PERRL, conjunctiva/corneas clear, EOM's intact       Lungs:     Clear to auscultation bilaterally, respirations unlabored  Heart:    Regular rate and rhythm  Neurologic:   Awake, alert, oriented x 3. No apparent focal neurological           defect.          Assessment & Plan    1. Acute frontal sinusitis, recurrence not specified Worsening over the last week. Go ahead and start .doxcycline prescribed by Fabio Bering.   2. Cough Previous cough syrup didn't help, change to - HYDROcodone-homatropine (HYCODAN) 5-1.5 MG/5ML syrup;  Take 5 mLs by mouth every 8 (eight) hours as needed.  Dispense: 100 mL; Refill: 0  Call if symptoms change or if not rapidly improving.        Lelon Huh, MD  [facility]

## 2018-03-20 NOTE — Patient Instructions (Addendum)
  Go ahead and start taking doxycycline for sinus infection  To help prevent sinus infections   Start using fluticasone nasal spray at the first sign of allergies such as nasal drainage or congestion   Rinse nasal passages with saline nasal spray or sterile saline irrigators when the pollen count goes up   Start taking zinc lozenges (such as Coldeeze) at the first sign of a cold, such as sore throat or runny nose

## 2018-04-04 DIAGNOSIS — H6121 Impacted cerumen, right ear: Secondary | ICD-10-CM | POA: Diagnosis not present

## 2018-04-07 ENCOUNTER — Ambulatory Visit (INDEPENDENT_AMBULATORY_CARE_PROVIDER_SITE_OTHER): Payer: Medicare HMO | Admitting: Physician Assistant

## 2018-04-07 ENCOUNTER — Encounter: Payer: Self-pay | Admitting: Physician Assistant

## 2018-04-07 VITALS — BP 125/77 | HR 82 | Temp 97.7°F | Wt 154.0 lb

## 2018-04-07 DIAGNOSIS — J0110 Acute frontal sinusitis, unspecified: Secondary | ICD-10-CM

## 2018-04-07 MED ORDER — AMOXICILLIN-POT CLAVULANATE 875-125 MG PO TABS: 1.0000 | ORAL_TABLET | Freq: Two times a day (BID) | ORAL | 0 refills | Status: AC

## 2018-04-07 NOTE — Progress Notes (Signed)
Patient: Gregory Arroyo Male    DOB: August 14, 1940   78 y.o.   MRN: [id number] Visit Date: 04/07/2018  Today's Provider: Trinna Post, PA-C   Chief Complaint  Patient presents with  . URI   Subjective:   Was seen prior twice in clinic for URI and sinus infection. Treated symptomatically on 03/14/2018 and took doxycycline 100 mg BID x 7 days started on 03/20/2018. Has completed that 03/27/2018. Feels like it got better and then returned.  URI   This is a recurrent problem. The current episode started 1 to 4 weeks ago. The problem has been unchanged. There has been no fever. Associated symptoms include congestion and coughing. He has tried antihistamine, NSAIDs and decongestant for the symptoms. The treatment provided mild relief.     Allergies  Allergen Reactions  . Pollen Extract      Current Outpatient Medications:  .  aspirin 81 MG tablet, Take 1 tablet by mouth daily., Disp: , Rfl:  .  Cetirizine HCl (ZYRTEC ALLERGY) 10 MG CAPS, Take 1 tablet by mouth daily. , Disp: , Rfl:  .  fluticasone (FLONASE) 50 MCG/ACT nasal spray, Place 2 sprays into both nostrils daily., Disp: 16 g, Rfl: 5 .  HYDROcodone-homatropine (HYCODAN) 5-1.5 MG/5ML syrup, Take 5 mLs by mouth every 8 (eight) hours as needed., Disp: 100 mL, Rfl: 0 .  loratadine (CLARITIN) 10 MG tablet, Take 1 tablet by mouth daily as needed., Disp: , Rfl:  .  Omega-3 Fatty Acids (FISH OIL PO), Take 1 capsule by mouth daily., Disp: , Rfl:   Review of Systems  Constitutional: Negative.   HENT: Positive for congestion and postnasal drip.   Respiratory: Positive for cough and shortness of breath.   Genitourinary: Negative.   Neurological: Negative.     Social History   Tobacco Use  . Smoking status: Former Smoker    Packs/day: 1.00    Years: 2.00    Pack years: 2.00    Types: Cigarettes    Last attempt to quit: 02/04/1958    Years since quitting: 60.2  . Smokeless tobacco: Never Used  Substance Use Topics  .  Alcohol use: Yes    Alcohol/week: 1.0 standard drinks    Types: 1 Glasses of wine per week      Objective:   There were no vitals taken for this visit. There were no vitals filed for this visit.   Physical Exam Constitutional:      General: He is not in acute distress.    Appearance: He is well-developed. He is not diaphoretic.  HENT:     Right Ear: External ear normal.     Left Ear: External ear normal.     Nose:     Right Sinus: No maxillary sinus tenderness or frontal sinus tenderness.     Left Sinus: No maxillary sinus tenderness or frontal sinus tenderness.     Mouth/Throat:     Pharynx: No oropharyngeal exudate or posterior oropharyngeal erythema.     Tonsils: No tonsillar abscesses.  Eyes:     Conjunctiva/sclera: Conjunctivae normal.  Neck:     Musculoskeletal: Normal range of motion.  Cardiovascular:     Rate and Rhythm: Normal rate and regular rhythm.  Pulmonary:     Effort: Pulmonary effort is normal.     Breath sounds: Normal breath sounds.  Skin:    General: Skin is warm and dry.  Neurological:     Mental Status: He is alert and  oriented to person, place, and time.  Psychiatric:        Behavior: Behavior normal.         Assessment & Plan    1. Acute non-recurrent frontal sinusitis  Completed doxycycline, symptoms returned. Will start Augmentin as below. Counseled on using daily 2nd gen antihistamine, flonase, and coricidin.   - amoxicillin-clavulanate (AUGMENTIN) 875-125 MG tablet; Take 1 tablet by mouth 2 (two) times daily for 10 days.  Dispense: 20 tablet; Refill: 0  The entirety of the information documented in the History of Present Illness, Review of Systems and Physical Exam were personally obtained by me. Portions of this information were initially documented by [facility] , CMA and reviewed by me for thoroughness and accuracy.      Trinna Post, PA-C  [facility]

## 2018-04-07 NOTE — Patient Instructions (Signed)

## 2018-04-08 DIAGNOSIS — R69 Illness, unspecified: Secondary | ICD-10-CM | POA: Diagnosis not present

## 2018-05-11 DIAGNOSIS — R69 Illness, unspecified: Secondary | ICD-10-CM | POA: Diagnosis not present

## 2018-07-30 DIAGNOSIS — R69 Illness, unspecified: Secondary | ICD-10-CM | POA: Diagnosis not present

## 2018-08-18 DIAGNOSIS — R69 Illness, unspecified: Secondary | ICD-10-CM | POA: Diagnosis not present

## 2018-09-17 ENCOUNTER — Ambulatory Visit (INDEPENDENT_AMBULATORY_CARE_PROVIDER_SITE_OTHER): Payer: Medicare HMO | Admitting: Physician Assistant

## 2018-09-17 ENCOUNTER — Telehealth: Payer: Self-pay | Admitting: Family Medicine

## 2018-09-17 ENCOUNTER — Encounter: Payer: Self-pay | Admitting: Physician Assistant

## 2018-09-17 ENCOUNTER — Other Ambulatory Visit: Payer: Self-pay

## 2018-09-17 VITALS — BP 133/81 | HR 69 | Temp 97.6°F | Resp 16 | Ht 68.0 in | Wt 149.0 lb

## 2018-09-17 DIAGNOSIS — B370 Candidal stomatitis: Secondary | ICD-10-CM | POA: Diagnosis not present

## 2018-09-17 MED ORDER — NYSTATIN 100000 UNIT/ML MT SUSP: 5.0000 mL | Freq: Four times a day (QID) | OROMUCOSAL | 0 refills | Status: DC

## 2018-09-17 NOTE — Telephone Encounter (Signed)
Pt thinks he has thrush in his mouth.  He has a partial plate.  He says it is red and hurts on his tongue and lips.  CB#  [place]

## 2018-09-17 NOTE — Progress Notes (Signed)
Patient: Gregory Arroyo Male    DOB: Oct 09, 1940   78 y.o.   MRN: [id number] Visit Date: 09/18/2018  Today's Provider: Trinna Post, PA-C   Chief Complaint  Patient presents with  . Oral Pain   Subjective:     Oral Pain  This is a new problem. The current episode started 1 to 4 weeks ago (2 weeks). The problem occurs constantly. The problem has been gradually worsening. The pain is at a severity of 4/10. The pain is moderate. Associated symptoms include facial pain. Pertinent negatives include no difficulty swallowing, fever, oral bleeding, sinus pressure or thermal sensitivity. He has tried nothing for the symptoms.   Patient states he has had pain at the back of his tongue and under the front of his tongue for 2 weeks. Patient states he has had partial plates for 6 months.   Allergies  Allergen Reactions  . Pollen Extract      Current Outpatient Medications:  .  aspirin 81 MG tablet, Take 1 tablet by mouth daily., Disp: , Rfl:  .  Cetirizine HCl (ZYRTEC ALLERGY) 10 MG CAPS, Take 1 tablet by mouth daily. , Disp: , Rfl:  .  fluticasone (FLONASE) 50 MCG/ACT nasal spray, Place 2 sprays into both nostrils daily., Disp: 16 g, Rfl: 5 .  loratadine (CLARITIN) 10 MG tablet, Take 1 tablet by mouth daily as needed., Disp: , Rfl:  .  Omega-3 Fatty Acids (FISH OIL PO), Take 1 capsule by mouth daily., Disp: , Rfl:  .  HYDROcodone-homatropine (HYCODAN) 5-1.5 MG/5ML syrup, Take 5 mLs by mouth every 8 (eight) hours as needed. (Patient not taking: Reported on 09/17/2018), Disp: 100 mL, Rfl: 0 .  nystatin (MYCOSTATIN) 100000 UNIT/ML suspension, Take 5 mLs (500,000 Units total) by mouth 4 (four) times daily., Disp: 60 mL, Rfl: 0  Review of Systems  Constitutional: Negative for fever.  HENT: Negative for sinus pressure.     Social History   Tobacco Use  . Smoking status: Former Smoker    Packs/day: 1.00    Years: 2.00    Pack years: 2.00    Types: Cigarettes    Quit date:  02/04/1958    Years since quitting: 60.6  . Smokeless tobacco: Never Used  Substance Use Topics  . Alcohol use: Yes    Alcohol/week: 1.0 standard drinks    Types: 1 Glasses of wine per week      Objective:   BP 133/81 (BP Location: Right Arm, Patient Position: Sitting, Cuff Size: Large)   Pulse 69   Temp 97.6 F (36.4 C) (Oral)   Resp 16   Ht 5\' 8"  (1.727 m)   Wt 149 lb (67.6 kg)   SpO2 96%   BMI 22.66 kg/m  Vitals:   09/17/18 1439  BP: 133/81  Pulse: 69  Resp: 16  Temp: 97.6 F (36.4 C)  TempSrc: Oral  SpO2: 96%  Weight: 149 lb (67.6 kg)  Height: 5\' 8"  (1.727 m)     Physical Exam Constitutional:      Appearance: Normal appearance.  HENT:     Mouth/Throat:     Mouth: Mucous membranes are moist.     Pharynx: Oropharynx is clear.     Comments: Ulcerative lesion on lower gum line surrounded by clumpy white exudate.  Skin:    General: Skin is warm and dry.  Neurological:     Mental Status: He is alert and oriented to person, place, and time. Mental status  is at baseline.  Psychiatric:        Mood and Affect: Mood normal.        Behavior: Behavior normal.      No results found for any visits on 09/17/18.     Assessment & Plan    1. Oral thrush  - nystatin (MYCOSTATIN) 100000 UNIT/ML suspension; Take 5 mLs (500,000 Units total) by mouth 4 (four) times daily.  Dispense: 60 mL; Refill: 0  The entirety of the information documented in the History of Present Illness, Review of Systems and Physical Exam were personally obtained by me. Portions of this information were initially documented by [facility] [employer], CMA and reviewed by me for thoroughness and accuracy.       Trinna Post, PA-C  [facility]

## 2018-09-17 NOTE — Telephone Encounter (Signed)
Spoke with patient on the phone who had concerns of possible thrush. Patient reports for the past week to two weeks he has had oral pain. He states that pain initially started between his gum and cheek and spread to his tongue. Patient states that he did research online and found out that wearing a partial plate can cause thrush. Patient states that pain today seems to be spreading to his lips and tried doing a salt rinse but states that he had no relief. Prescreened patient who denied any Covid like symptoms, scheduled patient appointment this afternoon to see you for evaluation. KW

## 2018-09-17 NOTE — Patient Instructions (Signed)
Oral Thrush, Adult  Oral thrush is an infection in your mouth and throat. It causes white patches on your tongue and in your mouth. Follow these instructions at home: Helping with soreness   To lessen your pain: ? Drink cold liquids, like water and iced tea. ? Eat frozen ice pops or frozen juices. ? Eat foods that are easy to swallow, like gelatin and ice cream. ? Drink from a straw if the patches in your mouth are painful. General instructions  Take or use over-the-counter and prescription medicines only as told by your doctor. Medicine for oral thrush may be something to swallow, or it may be something to put on the infected area.  Eat plain yogurt that has live cultures in it. Read the label to make sure.  If you wear dentures: ? Take out your dentures before you go to bed. ? Brush them well. ? Soak them in a denture cleaner.  Rinse your mouth with warm salt-water many times a day. To make the salt-water mixture, completely dissolve 1/2-1 teaspoon of salt in 1 cup of warm water. Contact a doctor if:  Your problems are getting worse.  Your problems do not get better in less than 7 days with treatment.  Your infection is spreading. This may show as white patches on the skin outside of your mouth.  You are nursing your baby and you have redness and pain in the nipples. This information is not intended to replace advice given to you by your health care provider. Make sure you discuss any questions you have with your health care provider. Document Released: 04/17/2009 Document Revised: 04/25/2017 Document Reviewed: 10/16/2015 Elsevier Patient Education  2020 [employer].

## 2018-09-23 DIAGNOSIS — R69 Illness, unspecified: Secondary | ICD-10-CM | POA: Diagnosis not present

## 2018-09-30 DIAGNOSIS — R69 Illness, unspecified: Secondary | ICD-10-CM | POA: Diagnosis not present

## 2018-10-23 DIAGNOSIS — R69 Illness, unspecified: Secondary | ICD-10-CM | POA: Diagnosis not present

## 2018-10-27 ENCOUNTER — Ambulatory Visit (INDEPENDENT_AMBULATORY_CARE_PROVIDER_SITE_OTHER): Payer: Medicare HMO | Admitting: Family Medicine

## 2018-10-27 ENCOUNTER — Encounter: Payer: Self-pay | Admitting: Family Medicine

## 2018-10-27 ENCOUNTER — Telehealth: Payer: Self-pay | Admitting: Family Medicine

## 2018-10-27 ENCOUNTER — Other Ambulatory Visit: Payer: Self-pay

## 2018-10-27 VITALS — BP 118/72 | HR 61 | Temp 96.6°F | Resp 15 | Wt 148.0 lb

## 2018-10-27 DIAGNOSIS — K130 Diseases of lips: Secondary | ICD-10-CM

## 2018-10-27 DIAGNOSIS — H6191 Disorder of right external ear, unspecified: Secondary | ICD-10-CM | POA: Diagnosis not present

## 2018-10-27 DIAGNOSIS — K140 Glossitis: Secondary | ICD-10-CM | POA: Diagnosis not present

## 2018-10-27 MED ORDER — MUPIROCIN 2 % EX OINT: 1.0000 "application " | TOPICAL_OINTMENT | Freq: Two times a day (BID) | CUTANEOUS | 0 refills | Status: DC

## 2018-10-27 MED ORDER — MAGIC MOUTHWASH: ORAL | 0 refills | Status: DC

## 2018-10-27 NOTE — Telephone Encounter (Signed)
Pt missing the mouth wash he was supposed to pick up after today's visit.  He uses:   Preferred Pharmacies      [id number] [address], [place] [phone] (Phone) [phone] (Fax)   Thanks, [facility]

## 2018-10-27 NOTE — Telephone Encounter (Signed)
Voicemail left on pharmacy line for Magic mouthwash. Kw

## 2018-10-27 NOTE — Telephone Encounter (Signed)
Please call in magic mouthwash. Cannot be e-prescribed

## 2018-10-27 NOTE — Patient Instructions (Signed)
.   Please review the attached list of medications and notify my office if there are any errors.   . Please bring all of your medications to every appointment so we can make sure that our medication list is the same as yours.   . It is especially important to get the annual flu vaccine this year. If you haven't had it already, please go to your pharmacy or call the office as soon as possible to schedule you flu shot.  

## 2018-10-27 NOTE — Progress Notes (Signed)
Patient: Gregory Arroyo Male    DOB: 10/15/1940   78 y.o.   MRN: [id number] Visit Date: 10/27/2018  Today's Provider: Lelon Huh, MD   Chief Complaint  Patient presents with  . Oral Pain   Subjective:     Oral Pain  This is a new problem. The current episode started 1 to 4 weeks ago. The problem occurs constantly. The problem has been unchanged. The pain is moderate. Pertinent negatives include no difficulty swallowing, facial pain, fever, oral bleeding, sinus pressure or thermal sensitivity. Treatments tried: Benadryl and salt water rinse. The treatment provided mild relief.    Allergies  Allergen Reactions  . Pollen Extract      Current Outpatient Medications:  .  aspirin 81 MG tablet, Take 1 tablet by mouth daily., Disp: , Rfl:  .  Cetirizine HCl (ZYRTEC ALLERGY) 10 MG CAPS, Take 1 tablet by mouth daily. , Disp: , Rfl:  .  fluticasone (FLONASE) 50 MCG/ACT nasal spray, Place 2 sprays into both nostrils daily., Disp: 16 g, Rfl: 5 .  loratadine (CLARITIN) 10 MG tablet, Take 1 tablet by mouth daily as needed., Disp: , Rfl:  .  Omega-3 Fatty Acids (FISH OIL PO), Take 1 capsule by mouth daily., Disp: , Rfl:  .  nystatin (MYCOSTATIN) 100000 UNIT/ML suspension, Take 5 mLs (500,000 Units total) by mouth 4 (four) times daily. (Patient not taking: Reported on 10/27/2018), Disp: 60 mL, Rfl: 0  Review of Systems  Constitutional: Negative for fever.  HENT: Negative for sinus pressure.     Social History   Tobacco Use  . Smoking status: Former Smoker    Packs/day: 1.00    Years: 2.00    Pack years: 2.00    Types: Cigarettes    Quit date: 02/04/1958    Years since quitting: 60.7  . Smokeless tobacco: Never Used  Substance Use Topics  . Alcohol use: Yes    Alcohol/week: 1.0 standard drinks    Types: 1 Glasses of wine per week      Objective:   BP 118/72   Pulse 61   Temp (!) 96.6 F (35.9 C) (Oral)   Resp 15   Wt 148 lb (67.1 kg)   SpO2 99%   BMI 22.50 kg/m   Vitals:   10/27/18 1045  BP: 118/72  Pulse: 61  Resp: 15  Temp: (!) 96.6 F (35.9 C)  TempSrc: Oral  SpO2: 99%  Weight: 148 lb (67.1 kg)  Body mass index is 22.5 kg/m.   Physical Exam  General appearance: alert, well developed, well nourished, cooperative and in no distress Oral: Mild crusty patch or erythema just below lower lip at midline. Patch of inflammation at left base of tongue, no masses.  Respiratory: Respirations even and unlabored, normal respiratory rate Extremities: All extremities are intact.  Skin: Black crusty lesion right exterior ear which he states has been there for over a month and won't hea.   Psych: Appropriate mood and affect. Neurologic: Mental status: Alert, oriented to person, place, and time, thought content appropriate.      Assessment & Plan     1. Rash on lips  - mupirocin ointment (BACTROBAN) 2 %; Place 1 application into the nose 2 (two) times daily.  Dispense: 22 g; Refill: 0  2. Glossitis Will call in prescription for magic mouthwash and to call if not improving in 1-2 weeks.   3. Skin lesion of right external ear He states lesion has been  there for over a month and he can't get in touch with Dr.Stewarts office.  - Ambulatory referral to Dermatology   The entirety of the information documented in the History of Present Illness, Review of Systems and Physical Exam were personally obtained by me. Portions of this information were initially documented by Minette Headland, CMA and reviewed by me for thoroughness and accuracy.      Lelon Huh, MD  [facility]

## 2018-11-02 NOTE — Progress Notes (Signed)
Subjective:   Gregory Arroyo is a 78 y.o. male who presents for Medicare Annual/Subsequent preventive examination.    This visit is being conducted through telemedicine due to the COVID-19 pandemic. This patient has given me verbal consent via doximity to conduct this visit, patient states they are participating from their home address. Some vital signs may be absent or patient reported.    Patient identification: identified by name, DOB, and current address  Review of Systems:  N/A  Cardiac Risk Factors include: advanced age (>31men, >12 women);male gender     Objective:    Vitals: There were no vitals taken for this visit.  There is no height or weight on file to calculate BMI. Unable to obtain vitals due to visit being conducted via telephonically.   Advanced Directives 11/03/2018 10/31/2017 10/29/2016 05/06/2015  Does Patient Have a Medical Advance Directive? Yes Yes Yes No  Type of Paramedic of [place];Living will [place];Living will [place];Living will -  Copy of [place] in Chart? Yes - validated most recent copy scanned in chart (See row information) No - copy requested No - copy requested -  Would patient like information on creating a medical advance directive? - - - Yes - Educational materials given    Tobacco Social History   Tobacco Use  Smoking Status Former Smoker  . Packs/day: 1.00  . Years: 2.00  . Pack years: 2.00  . Types: Cigarettes  . Quit date: 02/04/1958  . Years since quitting: 60.7  Smokeless Tobacco Never Used     Counseling given: Not Answered   Clinical Intake:  Pre-visit preparation completed: Yes  Pain : No/denies pain Pain Score: 0-No pain     Nutritional Risks: None Diabetes: No  How often do you need to have someone help you when you read instructions, pamphlets, or other written materials from your doctor or pharmacy?: 1 - Never  Interpreter  Needed?: No  Information entered by :: [facility], LPN  Past Medical History:  Diagnosis Date  . History of chicken pox   . History of measles   . Hx of squamous cell carcinoma of skin 02/15/2013   L ear concha   Past Surgical History:  Procedure Laterality Date  . CATARACT EXTRACTION Right 04/2011   Dr. Martie Round, [place]  . HERNIA REPAIR Right 2010   Dr. Bary Castilla; Inguinal Hernia  . [place]  2001  . SHOULDER SURGERY Left 2009   Dr. Sabra Heck   Family History  Problem Relation Age of Onset  . Stroke Mother   . Heart disease Mother   . Transient ischemic attack Mother   . Heart attack Father   . Heart disease Father   . Colon cancer Sister 42   Social History   Socioeconomic History  . Marital status: Married    Spouse name: Not on file  . Number of children: 4  . Years of education: Not on file  . Highest education level: Associate degree: occupational, Hotel manager, or vocational program  Occupational History  . 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 Financial resource strain: Not hard at all  . Food insecurity    Worry: Never true    Inability: Never true  . Transportation needs    Medical: No    Non-medical: No  Tobacco  Use  . Smoking status: Former Smoker    Packs/day: 1.00    Years: 2.00    Pack years: 2.00    Types: Cigarettes    Quit date: 02/04/1958    Years since quitting: 60.7  . Smokeless tobacco: Never Used  Substance and Sexual Activity  . Alcohol use: Yes    Alcohol/week: 7.0 standard drinks    Types: 7 Glasses of wine per week  . Drug use: No  . Sexual activity: Not on file  Lifestyle  . Physical activity    Days per week: 0 days    Minutes per session: 0 min  . Stress: Not at all  Relationships  . Social Herbalist on phone: Patient refused    Gets together: Patient refused     Attends religious service: Patient refused    Active member of club or organization: Patient refused    Attends meetings of clubs or organizations: Patient refused    Relationship status: Patient refused  Other Topics Concern  . Not on file  Social History Narrative  . Not on file    Outpatient Encounter Medications as of 11/03/2018  Medication Sig  . aspirin 81 MG tablet Take 1 tablet by mouth daily.  . Cetirizine HCl (ZYRTEC ALLERGY) 10 MG CAPS Take 1 tablet by mouth daily.   . fluticasone (FLONASE) 50 MCG/ACT nasal spray Place 2 sprays into both nostrils daily. (Patient taking differently: Place 2 sprays into both nostrils daily. As needed)  . loratadine (CLARITIN) 10 MG tablet Take 1 tablet by mouth daily as needed.  . magic mouthwash SOLN 1 Part Lidocaine 1 Part diphenhydramine HCL 1 Part Maalox 1 Part Nystatin  Swish, gargle, and spit.  Take 4 times daily for thrush.  . mupirocin ointment (BACTROBAN) 2 % Place 1 application into the nose 2 (two) times daily. (Patient taking differently: Place 1 application into the nose 2 (two) times daily. As needed)  . Omega-3 Fatty Acids (FISH OIL PO) Take 1 capsule by mouth daily.  Marland Kitchen nystatin (MYCOSTATIN) 100000 UNIT/ML suspension Take 5 mLs (500,000 Units total) by mouth 4 (four) times daily. (Patient not taking: Reported on 10/27/2018)   No facility-administered encounter medications on file as of 11/03/2018.     Activities of Daily Living In your present state of health, do you have any difficulty performing the following activities: 11/03/2018  Hearing? Y  Comment Wears bilateral hearing aids.  Vision? N  Difficulty concentrating or making decisions? N  Walking or climbing stairs? N  Dressing or bathing? N  Doing errands, shopping? N  Preparing Food and eating ? N  Using the Toilet? N  In the past six months, have you accidently leaked urine? N  Do you have problems with loss of bowel control? N  Managing your Medications? N   Managing your Finances? N  Housekeeping or managing your Housekeeping? N  Some recent data might be hidden    Patient Care Team: Birdie Sons, MD as PCP - General (Family Medicine) Melissa Noon, [place] as Referring Physician (Optometry) Brendolyn Patty, MD (Dermatology)   Assessment:   This is a routine wellness examination for [place].  Exercise Activities and Dietary recommendations Current Exercise Habits: The patient does not participate in regular exercise at present, Exercise limited by: None identified  Goals    . Increase water intake     Recommend increasing water intake to 4-6 glasses a day.       Fall Risk: Fall Risk  11/03/2018 10/31/2017 10/29/2016 09/30/2016 09/30/2014  Falls in the past year? 0 No No No No    FALL RISK PREVENTION PERTAINING TO THE HOME:  Any stairs in or around the home? Yes  If so, are there any without handrails? N/A  Home free of loose throw rugs in walkways, pet beds, electrical cords, etc? Yes  Adequate lighting in your home to reduce risk of falls? Yes   ASSISTIVE DEVICES UTILIZED TO PREVENT FALLS:  Life alert? No  Use of a cane, walker or w/c? No  Grab bars in the bathroom? No  Shower chair or bench in shower? No  Elevated toilet seat or a handicapped toilet? No   TIMED UP AND GO:  Was the test performed? No .    Depression Screen PHQ 2/9 Scores 11/03/2018 10/31/2017 10/29/2016 09/30/2016  PHQ - 2 Score 0 0 0 0  PHQ- 9 Score - - - -    Cognitive Function: Declined today.         Immunization History  Administered Date(s) Administered  . Influenza, High Dose Seasonal PF 11/06/2013, 01/06/2015, 12/21/2015, 09/30/2016, 10/31/2017, 10/23/2018  . Pneumococcal Conjugate-13 01/06/2015  . Pneumococcal Polysaccharide-23 04/27/2008  . Tdap 05/27/2011  . Zoster 05/27/2011  . Zoster Recombinat (Shingrix) 05/19/2017, 10/02/2018    Qualifies for Shingles Vaccine? Completed series  Tdap: Up to date  Flu Vaccine: Up to date   Pneumococcal Vaccine: Completed series  Screening Tests Health Maintenance  Topic Date Due  . TETANUS/TDAP  05/26/2021  . INFLUENZA VACCINE  Completed  . PNA vac Low Risk Adult  Completed   Cancer Screenings:  Colorectal Screening: No longer required.   Lung Cancer Screening: (Low Dose CT Chest recommended if Age 16-80 years, 30 pack-year currently smoking OR have quit w/in 15years.) does not qualify.   Additional Screening:  Vision Screening: Recommended annual ophthalmology exams for early detection of glaucoma and other disorders of the eye.  Dental Screening: Recommended annual dental exams for proper oral hygiene  Community Resource Referral:  CRR required this visit?  No        Plan:  I have personally reviewed and addressed the Medicare Annual Wellness questionnaire and have noted the following in the patient's chart:  A. Medical and social history B. Use of alcohol, tobacco or illicit drugs  C. Current medications and supplements D. Functional ability and status E.  Nutritional status F.  Physical activity G. Advance directives H. List of other physicians I.  Hospitalizations, surgeries, and ER visits in previous 12 months J.  [place] such as hearing and vision if needed, cognitive and depression L. Referrals and appointments   In addition, I have reviewed and discussed with patient certain preventive protocols, quality metrics, and best practice recommendations. A written personalized care plan for preventive services as well as general preventive health recommendations were provided to patient.   Glendora Score, LPN  [id number] Nurse Health Advisor   Nurse Notes: None.

## 2018-11-03 ENCOUNTER — Ambulatory Visit (INDEPENDENT_AMBULATORY_CARE_PROVIDER_SITE_OTHER): Payer: Medicare HMO

## 2018-11-03 ENCOUNTER — Other Ambulatory Visit: Payer: Self-pay

## 2018-11-03 DIAGNOSIS — Z0000 Encounter for general adult medical examination without abnormal findings: Secondary | ICD-10-CM | POA: Diagnosis not present

## 2018-11-03 NOTE — Patient Instructions (Signed)
Gregory Arroyo , Thank you for taking time to come for your Medicare Wellness Visit. I appreciate your ongoing commitment to your health goals. Please review the following plan we discussed and let me know if I can assist you in the future.   Screening recommendations/referrals: Colonoscopy: No longer required.  Recommended yearly ophthalmology/optometry visit for glaucoma screening and checkup Recommended yearly dental visit for hygiene and checkup  Vaccinations: Influenza vaccine: Up to date Pneumococcal vaccine: Completed series Tdap vaccine: Up to date, due 05/2021 Shingles vaccine: Completed series    Advanced directives: Currently on file.   Conditions/risks identified: Continue to increase water intake to 6-8 8 oz glasses as tolerated.   Next appointment: Declined scheduling a CPE or AWV for 2021.   Preventive Care 78 Years and Older, Male Preventive care refers to lifestyle choices and visits with your health care provider that can promote health and wellness. What does preventive care include?  A yearly physical exam. This is also called an annual well check.  Dental exams once or twice a year.  Routine eye exams. Ask your health care provider how often you should have your eyes checked.  Personal lifestyle choices, including:  Daily care of your teeth and gums.  Regular physical activity.  Eating a healthy diet.  Avoiding tobacco and drug use.  Limiting alcohol use.  Practicing safe sex.  Taking low doses of aspirin every day.  Taking vitamin and mineral supplements as recommended by your health care provider. What happens during an annual well check? The services and screenings done by your health care provider during your annual well check will depend on your age, overall health, lifestyle risk factors, and family history of disease. Counseling  Your health care provider may ask you questions about your:  Alcohol use.  Tobacco use.  Drug use.   Emotional well-being.  Home and relationship well-being.  Sexual activity.  Eating habits.  History of falls.  Memory and ability to understand (cognition).  Work and work Statistician. Screening  You may have the following tests or measurements:  Height, weight, and BMI.  Blood pressure.  Lipid and cholesterol levels. These may be checked every 5 years, or more frequently if you are over 72 years old.  Skin check.  Lung cancer screening. You may have this screening every year starting at age 24 if you have a 30-pack-year history of smoking and currently smoke or have quit within the past 15 years.  Fecal occult blood test (FOBT) of the stool. You may have this test every year starting at age 37.  Flexible sigmoidoscopy or colonoscopy. You may have a sigmoidoscopy every 5 years or a colonoscopy every 10 years starting at age 7.  Prostate cancer screening. Recommendations will vary depending on your family history and other risks.  Hepatitis C blood test.  Hepatitis B blood test.  Sexually transmitted disease (STD) testing.  Diabetes screening. This is done by checking your blood sugar (glucose) after you have not eaten for a while (fasting). You may have this done every 1-3 years.  Abdominal aortic aneurysm (AAA) screening. You may need this if you are a current or former smoker.  Osteoporosis. You may be screened starting at age 46 if you are at high risk. Talk with your health care provider about your test results, treatment options, and if necessary, the need for more tests. Vaccines  Your health care provider may recommend certain vaccines, such as:  Influenza vaccine. This is recommended every year.  Tetanus,  diphtheria, and acellular pertussis (Tdap, Td) vaccine. You may need a Td booster every 10 years.  Zoster vaccine. You may need this after age 32.  Pneumococcal 13-valent conjugate (PCV13) vaccine. One dose is recommended after age 12.  Pneumococcal  polysaccharide (PPSV23) vaccine. One dose is recommended after age 45. Talk to your health care provider about which screenings and vaccines you need and how often you need them. This information is not intended to replace advice given to you by your health care provider. Make sure you discuss any questions you have with your health care provider. Document Released: 02/17/2015 Document Revised: 10/11/2015 Document Reviewed: 11/22/2014 Elsevier Interactive Patient Education  2017 [place] Prevention in the Home Falls can cause injuries. They can happen to people of all ages. There are many things you can do to make your home safe and to help prevent falls. What can I do on the outside of my home?  Regularly fix the edges of walkways and driveways and fix any cracks.  Remove anything that might make you trip as you walk through a door, such as a raised step or threshold.  Trim any bushes or trees on the path to your home.  Use bright outdoor lighting.  Clear any walking paths of anything that might make someone trip, such as rocks or tools.  Regularly check to see if handrails are loose or broken. Make sure that both sides of any steps have handrails.  Any raised decks and porches should have guardrails on the edges.  Have any leaves, snow, or ice cleared regularly.  Use sand or salt on walking paths during winter.  Clean up any spills in your garage right away. This includes oil or grease spills. What can I do in the bathroom?  Use night lights.  Install grab bars by the toilet and in the tub and shower. Do not use towel bars as grab bars.  Use non-skid mats or decals in the tub or shower.  If you need to sit down in the shower, use a plastic, non-slip stool.  Keep the floor dry. Clean up any water that spills on the floor as soon as it happens.  Remove soap buildup in the tub or shower regularly.  Attach bath mats securely with double-sided non-slip rug tape.   Do not have throw rugs and other things on the floor that can make you trip. What can I do in the bedroom?  Use night lights.  Make sure that you have a light by your bed that is easy to reach.  Do not use any sheets or blankets that are too big for your bed. They should not hang down onto the floor.  Have a firm chair that has side arms. You can use this for support while you get dressed.  Do not have throw rugs and other things on the floor that can make you trip. What can I do in the kitchen?  Clean up any spills right away.  Avoid walking on wet floors.  Keep items that you use a lot in easy-to-reach places.  If you need to reach something above you, use a strong step stool that has a grab bar.  Keep electrical cords out of the way.  Do not use floor polish or wax that makes floors slippery. If you must use wax, use non-skid floor wax.  Do not have throw rugs and other things on the floor that can make you trip. What can I do with  my stairs?  Do not leave any items on the stairs.  Make sure that there are handrails on both sides of the stairs and use them. Fix handrails that are broken or loose. Make sure that handrails are as long as the stairways.  Check any carpeting to make sure that it is firmly attached to the stairs. Fix any carpet that is loose or worn.  Avoid having throw rugs at the top or bottom of the stairs. If you do have throw rugs, attach them to the floor with carpet tape.  Make sure that you have a light switch at the top of the stairs and the bottom of the stairs. If you do not have them, ask someone to add them for you. What else can I do to help prevent falls?  Wear shoes that:  Do not have high heels.  Have rubber bottoms.  Are comfortable and fit you well.  Are closed at the toe. Do not wear sandals.  If you use a stepladder:  Make sure that it is fully opened. Do not climb a closed stepladder.  Make sure that both sides of the  stepladder are locked into place.  Ask someone to hold it for you, if possible.  Clearly mark and make sure that you can see:  Any grab bars or handrails.  First and last steps.  Where the edge of each step is.  Use tools that help you move around (mobility aids) if they are needed. These include:  Canes.  Walkers.  Scooters.  Crutches.  Turn on the lights when you go into a dark area. Replace any light bulbs as soon as they burn out.  Set up your furniture so you have a clear path. Avoid moving your furniture around.  If any of your floors are uneven, fix them.  If there are any pets around you, be aware of where they are.  Review your medicines with your doctor. Some medicines can make you feel dizzy. This can increase your chance of falling. Ask your doctor what other things that you can do to help prevent falls. This information is not intended to replace advice given to you by your health care provider. Make sure you discuss any questions you have with your health care provider. Document Released: 11/17/2008 Document Revised: 06/29/2015 Document Reviewed: 02/25/2014 Elsevier Interactive Patient Education  2017 [employer].

## 2018-11-04 DIAGNOSIS — C44212 Basal cell carcinoma of skin of right ear and external auricular canal: Secondary | ICD-10-CM | POA: Diagnosis not present

## 2018-11-04 DIAGNOSIS — C4491 Basal cell carcinoma of skin, unspecified: Secondary | ICD-10-CM

## 2018-11-04 DIAGNOSIS — L239 Allergic contact dermatitis, unspecified cause: Secondary | ICD-10-CM | POA: Diagnosis not present

## 2018-11-04 HISTORY — DX: Basal cell carcinoma of skin, unspecified: C44.91

## 2018-11-30 ENCOUNTER — Telehealth: Payer: Self-pay

## 2018-11-30 NOTE — Telephone Encounter (Signed)
LMTCB

## 2018-11-30 NOTE — Telephone Encounter (Signed)
Patient advised. He will call to check the prices and call back, so RX can be sent to pharmacy of choice.

## 2018-11-30 NOTE — Telephone Encounter (Signed)
Pt stated that the rash on his lips that he was seen for on 10/27/2018 has returned. Pt stated that magic mouthwash SOLN helped a lot the last time but he used it all and it also wasn't covered by his insurance. Pt is requesting Dr. Caryn Section send in an Rx to [facility] [address] for something similar, that his insurance will cover. Please advise. Thanks TNP

## 2018-11-30 NOTE — Telephone Encounter (Signed)
There's not anything similar, but he could probably get it cheaper at another pharmacy. We can [facility] into [facility] or he can check with other pharmacies for a better price. [facility], [facility], and [facility] usually have the best prices.

## 2018-12-15 DIAGNOSIS — R69 Illness, unspecified: Secondary | ICD-10-CM | POA: Diagnosis not present

## 2018-12-29 DIAGNOSIS — R69 Illness, unspecified: Secondary | ICD-10-CM | POA: Diagnosis not present

## 2019-01-12 ENCOUNTER — Encounter: Payer: Self-pay | Admitting: Family Medicine

## 2019-01-12 ENCOUNTER — Ambulatory Visit (INDEPENDENT_AMBULATORY_CARE_PROVIDER_SITE_OTHER): Payer: Medicare HMO | Admitting: Family Medicine

## 2019-01-12 ENCOUNTER — Other Ambulatory Visit: Payer: Self-pay

## 2019-01-12 VITALS — BP 120/60 | HR 87 | Temp 97.1°F | Resp 16 | Wt 151.0 lb

## 2019-01-12 DIAGNOSIS — K130 Diseases of lips: Secondary | ICD-10-CM | POA: Diagnosis not present

## 2019-01-12 DIAGNOSIS — B001 Herpesviral vesicular dermatitis: Secondary | ICD-10-CM | POA: Diagnosis not present

## 2019-01-12 MED ORDER — ACYCLOVIR 200 MG PO CAPS: 200.0000 mg | ORAL_CAPSULE | Freq: Every day | ORAL | 0 refills | Status: AC

## 2019-01-12 NOTE — Patient Instructions (Addendum)
.   Try taking Lysine 1gram twice a day   Apply Vaseline to corners of both twice a day    Call for prescription of antibiotic cream if not much better within a week

## 2019-01-12 NOTE — Progress Notes (Signed)
.       Patient: Gregory Arroyo Male    DOB: 1940-09-30   78 y.o.   MRN: [id number] Visit Date: 01/12/2019  Today's Provider: Lelon Huh, MD   Chief Complaint  Patient presents with  . Mouth Lesions   Subjective:     Mouth Lesions  Episode onset: 2 months ago. The problem occurs continuously. The problem has been unchanged. Associated symptoms include mouth sores (sores on lips). Pertinent negatives include no fever, no abdominal pain, no nausea, no vomiting and no wheezing.  Patient has been using chap stick to help moisturize lips    Allergies  Allergen Reactions  . Pollen Extract       Current Outpatient Medications:  .  aspirin 81 MG tablet, Take 1 tablet by mouth daily., Disp: , Rfl:  .  Cetirizine HCl (ZYRTEC ALLERGY) 10 MG CAPS, Take 1 tablet by mouth daily. , Disp: , Rfl:  .  fluticasone (FLONASE) 50 MCG/ACT nasal spray, Place 2 sprays into both nostrils daily. (Patient taking differently: Place 2 sprays into both nostrils daily. As needed), Disp: 16 g, Rfl: 5 .  loratadine (CLARITIN) 10 MG tablet, Take 1 tablet by mouth daily as needed., Disp: , Rfl:  .  Omega-3 Fatty Acids (FISH OIL PO), Take 1 capsule by mouth daily., Disp: , Rfl:   Review of Systems  Constitutional: Negative for appetite change, chills and fever.  HENT: Positive for mouth sores (sores on lips).   Respiratory: Negative for chest tightness, shortness of breath and wheezing.   Cardiovascular: Negative for chest pain and palpitations.  Gastrointestinal: Negative for abdominal pain, nausea and vomiting.    Social History   Tobacco Use  . Smoking status: Former Smoker    Packs/day: 1.00    Years: 2.00    Pack years: 2.00    Types: Cigarettes    Quit date: 02/04/1958    Years since quitting: 60.9  . Smokeless tobacco: Never Used  Substance Use Topics  . Alcohol use: Yes    Alcohol/week: 7.0 standard drinks    Types: 7 Glasses of wine per week      Objective:   BP 120/60 (BP  Location: Left Arm, Patient Position: Sitting, Cuff Size: Normal)   Pulse 87   Temp (!) 97.1 F (36.2 C) (Temporal)   Resp 16   Wt 151 lb (68.5 kg)   SpO2 97% Comment: room air  BMI 22.96 kg/m  Vitals:   01/12/19 1012  BP: 120/60  Pulse: 87  Resp: 16  Temp: (!) 97.1 F (36.2 C)  TempSrc: Temporal  SpO2: 97%  Weight: 151 lb (68.5 kg)  Body mass index is 22.96 kg/m.   Physical Exam  Few small aphthous ulcers lower lip and cracked and irritated at angles of mouth. No bleeding, no surrounding erythema.        Assessment & Plan    1. Recurrent cold sores  - acyclovir (ZOVIRAX) 200 MG capsule; Take 1 capsule (200 mg total) by mouth 5 (five) times daily for 7 days.  Dispense: 35 capsule; Refill: 0  2. Angular cheilitis  Apply OTC petroleum jelly. Consider antifungal cream if not greatly improved in about a week.    The entirety of the information documented in the History of Present Illness, Review of Systems and Physical Exam were personally obtained by me. Portions of this information were initially documented by Meyer Cory, CMA and reviewed by me for thoroughness and accuracy.      Elenore Rota  Caryn Section, MD  [facility]

## 2019-01-19 DIAGNOSIS — L821 Other seborrheic keratosis: Secondary | ICD-10-CM | POA: Diagnosis not present

## 2019-01-19 DIAGNOSIS — L820 Inflamed seborrheic keratosis: Secondary | ICD-10-CM | POA: Diagnosis not present

## 2019-01-19 DIAGNOSIS — Z85828 Personal history of other malignant neoplasm of skin: Secondary | ICD-10-CM | POA: Diagnosis not present

## 2019-01-23 ENCOUNTER — Emergency Department
Admission: EM | Admit: 2019-01-23 | Discharge: 2019-01-23 | Disposition: A | Discharge status: Home / Self Care | Payer: Medicare HMO | Attending: Emergency Medicine | Admitting: Emergency Medicine

## 2019-01-23 ENCOUNTER — Other Ambulatory Visit: Payer: Self-pay

## 2019-01-23 ENCOUNTER — Emergency Department: Payer: Medicare HMO

## 2019-01-23 ENCOUNTER — Encounter: Payer: Self-pay | Admitting: Emergency Medicine

## 2019-01-23 DIAGNOSIS — Z20828 Contact with and (suspected) exposure to other viral communicable diseases: Secondary | ICD-10-CM | POA: Diagnosis not present

## 2019-01-23 DIAGNOSIS — R0781 Pleurodynia: Secondary | ICD-10-CM | POA: Diagnosis not present

## 2019-01-23 DIAGNOSIS — Z87891 Personal history of nicotine dependence: Secondary | ICD-10-CM | POA: Insufficient documentation

## 2019-01-23 DIAGNOSIS — Z79899 Other long term (current) drug therapy: Secondary | ICD-10-CM | POA: Insufficient documentation

## 2019-01-23 DIAGNOSIS — R071 Chest pain on breathing: Secondary | ICD-10-CM | POA: Insufficient documentation

## 2019-01-23 DIAGNOSIS — R079 Chest pain, unspecified: Secondary | ICD-10-CM | POA: Diagnosis not present

## 2019-01-23 DIAGNOSIS — Z7982 Long term (current) use of aspirin: Secondary | ICD-10-CM | POA: Insufficient documentation

## 2019-01-23 DIAGNOSIS — J189 Pneumonia, unspecified organism: Secondary | ICD-10-CM | POA: Diagnosis not present

## 2019-01-23 DIAGNOSIS — R0602 Shortness of breath: Secondary | ICD-10-CM | POA: Diagnosis not present

## 2019-01-23 LAB — CBC
HCT: 47.3 % (ref 39.0–52.0)
Hemoglobin: 15.7 g/dL (ref 13.0–17.0)
MCH: 31.5 pg (ref 26.0–34.0)
MCHC: 33.2 g/dL (ref 30.0–36.0)
MCV: 95 fL (ref 80.0–100.0)
Platelets: 321 10*3/uL (ref 150–400)
RBC: 4.98 MIL/uL (ref 4.22–5.81)
RDW: 12.4 % (ref 11.5–15.5)
WBC: 7.5 10*3/uL (ref 4.0–10.5)
nRBC: 0 % (ref 0.0–0.2)

## 2019-01-23 LAB — LIPASE, BLOOD: Lipase: 29 U/L (ref 11–51)

## 2019-01-23 LAB — BASIC METABOLIC PANEL
Anion gap: 11 (ref 5–15)
BUN: 18 mg/dL (ref 8–23)
CO2: 23 mmol/L (ref 22–32)
Calcium: 9.4 mg/dL (ref 8.9–10.3)
Chloride: 104 mmol/L (ref 98–111)
Creatinine, Ser: 1 mg/dL (ref 0.61–1.24)
GFR calc Af Amer: >60 mL/min (ref >60)
GFR calc non Af Amer: >60 mL/min (ref >60)
Glucose, Bld: 116 mg/dL — ABNORMAL HIGH (ref 70–99)
Potassium: 4.1 mmol/L (ref 3.5–5.1)
Sodium: 138 mmol/L (ref 135–145)

## 2019-01-23 LAB — URINALYSIS, COMPLETE (UACMP) WITH MICROSCOPIC
Bacteria, UA: NONE SEEN
Bilirubin Urine: NEGATIVE
Glucose, UA: NEGATIVE mg/dL
Hgb urine dipstick: NEGATIVE
Ketones, ur: NEGATIVE mg/dL
Leukocytes,Ua: NEGATIVE
Nitrite: NEGATIVE
Protein, ur: NEGATIVE mg/dL
Specific Gravity, Urine: 1.02 (ref 1.005–1.030)
Squamous Epithelial / HPF: NONE SEEN (ref 0–5)
WBC, UA: NONE SEEN WBC/hpf (ref 0–5)
pH: 5 (ref 5.0–8.0)

## 2019-01-23 LAB — TROPONIN I (HIGH SENSITIVITY)
Troponin I (High Sensitivity): 7 ng/L (ref <18)
Troponin I (High Sensitivity): 6 ng/L (ref <18)

## 2019-01-23 LAB — FIBRIN DERIVATIVES D-DIMER (ARMC ONLY): Fibrin derivatives D-dimer (ARMC): 2884.6 ng/mL (FEU) — ABNORMAL HIGH (ref 0.00–499.00)

## 2019-01-23 MED ORDER — AZITHROMYCIN 250 MG PO TABS: ORAL_TABLET | ORAL | 0 refills | Status: AC

## 2019-01-23 MED ORDER — AZITHROMYCIN 500 MG PO TABS
500.0000 mg | ORAL_TABLET | Freq: Once | ORAL | Status: AC
  Administered 2019-01-23: 500 mg via ORAL
  Filled 2019-01-23: qty 1

## 2019-01-23 MED ORDER — IOHEXOL 350 MG/ML SOLN
75.0000 mL | Freq: Once | INTRAVENOUS | Status: AC | PRN
  Administered 2019-01-23: 75 mL via INTRAVENOUS

## 2019-01-23 MED ORDER — SODIUM CHLORIDE 0.9% FLUSH
3.0000 mL | Freq: Once | INTRAVENOUS | Status: AC
  Administered 2019-01-23: 3 mL via INTRAVENOUS

## 2019-01-23 MED ORDER — HYDROCODONE-ACETAMINOPHEN 5-325 MG PO TABS: 1.0000 | ORAL_TABLET | Freq: Four times a day (QID) | ORAL | 0 refills | Status: DC | PRN

## 2019-01-23 NOTE — ED Notes (Signed)
Report received - pt needs covid swab and atb prior to d/c

## 2019-01-23 NOTE — Discharge Instructions (Addendum)
The CT scan does not show any blood clots.  There is only some pneumonia.  I will give you some Zithromax to take.  I will give you the first dose here in the emergency room and you can get the prescription filled tomorrow and keep taking it.  Please return for increasing pain fever or shortness of breath.  I will do a coronavirus test on you before you leave.  The results of that should be back within 24 hours.  If we do not call you by tomorrow night at this time you can call us in the morning on Monday.  I will also give you a little bit of Vicodin to use if needed for the pain.  Remember the Vicodin can make you woozy and constipated.  Do not drive on it.  Otherwise take 2 or 3 of the Motrin 3 times a day with food for no more than 3 days.  Please follow-up with your doctor and return if you are worse.

## 2019-01-23 NOTE — ED Provider Notes (Signed)
[facility] Emergency Department Provider Note   ____________________________________________   First MD Initiated Contact with Patient 01/23/19 1510     (approximate)  I have reviewed the triage vital signs and the nursing notes.   HISTORY  Chief Complaint Chest Pain    HPI Gregory Arroyo is a 78 y.o. male he reports sharp pleuritic chest pain in the right lower chest starting yesterday.  Is worse when he takes a deep breath of course.  He denies any increase in his baseline postnasal drip cough and he does not have a fever.  He had pneumonia as a child but no other problems.  Pain is moderately severe and again only there if he takes a deep breath.         Past Medical History:  Diagnosis Date  . History of chicken pox   . History of measles   . Hx of squamous cell carcinoma of skin 02/15/2013   L ear concha    Patient Active Problem List   Diagnosis Date Noted  . Reactive airway disease 05/08/2015  . H/O adenomatous polyp of colon 08/29/2014  . Pre-diabetes 08/29/2014  . Skin lesion of face 08/29/2014  . Vertigo 08/29/2014  . External hemorrhoids without complication [id number]  . Testicular hypofunction 05/06/2008  . Family history of colon cancer 04/28/2008  . Allergic rhinitis 04/27/2008  . Decreased libido 04/27/2008    Past Surgical History:  Procedure Laterality Date  . CATARACT EXTRACTION Right 04/2011   Dr. Martie Round, [place]  . HERNIA REPAIR Right 2010   Dr. Bary Castilla; Inguinal Hernia  . [place]  2001  . SHOULDER SURGERY Left 2009   Dr. Sabra Heck    Prior to Admission medications   Medication Sig Start Date End Date Taking? Authorizing Provider  aspirin 81 MG tablet Take 1 tablet by mouth daily. 04/27/08   [provider]  azithromycin (ZITHROMAX Z-PAK) 250 MG tablet Take 2 tablets (500 mg) on  Day 1,  followed by 1 tablet (250 mg) once daily on Days 2 through 5. 01/23/19  01/28/19  Nena Polio, MD  Cetirizine HCl (ZYRTEC ALLERGY) 10 MG CAPS Take 1 tablet by mouth daily.     [provider]  fluticasone (FLONASE) 50 MCG/ACT nasal spray Place 2 sprays into both nostrils daily. Patient taking differently: Place 2 sprays into both nostrils daily. As needed 12/04/16   Birdie Sons, MD  HYDROcodone-acetaminophen (NORCO/VICODIN) 5-325 MG tablet Take 1 tablet by mouth every 6 (six) hours as needed for moderate pain. 01/23/19   Nena Polio, MD  loratadine (CLARITIN) 10 MG tablet Take 1 tablet by mouth daily as needed.    [provider]  Omega-3 Fatty Acids (FISH OIL PO) Take 1 capsule by mouth daily.    [provider]    Allergies Pollen extract  Family History  Problem Relation Age of Onset  . Stroke Mother   . Heart disease Mother   . Transient ischemic attack Mother   . Heart attack Father   . Heart disease Father   . Colon cancer Sister 9    Social History Social History   Tobacco Use  . Smoking status: Former Smoker    Packs/day: 1.00    Years: 2.00    Pack years: 2.00    Types: Cigarettes    Quit date: 02/04/1958    Years since quitting: 61.0  . Smokeless tobacco: Never Used  Substance  Use Topics  . Alcohol use: Yes    Alcohol/week: 7.0 standard drinks    Types: 7 Glasses of wine per week  . Drug use: No    Review of Systems  Constitutional: No fever/chills Eyes: No visual changes. ENT: No sore throat. Cardiovascular: chest pain. Respiratory: Denies shortness of breath. Gastrointestinal: No abdominal pain.  No nausea, no vomiting.  No diarrhea.  No constipation. Genitourinary: Negative for dysuria. Musculoskeletal: Negative for back pain. Skin: Negative for rash. Neurological: Negative for headaches, focal weakness  ____________________________________________   PHYSICAL EXAM:  VITAL SIGNS: ED Triage Vitals  Enc Vitals Group     BP 01/23/19 0948 134/76     Pulse Rate 01/23/19 0948 80      Resp 01/23/19 0948 18     Temp 01/23/19 0948 98.2 F (36.8 C)     Temp Source 01/23/19 0948 Oral     SpO2 01/23/19 0948 96 %     Weight 01/23/19 0922 150 lb (68 kg)     Height 01/23/19 0922 5\' 6"  (1.676 m)     Head Circumference --      Peak Flow --      Pain Score 01/23/19 0919 5     Pain Loc --      Pain Edu? --      Excl. in [place]? --     Constitutional: Alert and oriented. Well appearing and in no acute distress. Eyes: Conjunctivae are normal.  Head: Atraumatic. Nose: No congestion/rhinnorhea. Mouth/Throat: Mucous membranes are moist.  Oropharynx non-erythematous. Neck: No stridor.  Cardiovascular: Normal rate, regular rhythm. Grossly normal heart sounds.  Good peripheral circulation. Respiratory: Normal respiratory effort.  No retractions. Lungs CTAB. Gastrointestinal: Soft and nontender. No distention. No abdominal bruits. No CVA tenderness. Musculoskeletal: No lower extremity tenderness nor edema. Neurologic:  Normal speech and language. No gross focal neurologic deficits are appreciated. Skin:  Skin is warm, dry and intact. No rash noted. Psychiatric: Mood and affect are normal. Speech and behavior are normal.  ____________________________________________   LABS (all labs ordered are listed, but only abnormal results are displayed)  Labs Reviewed  BASIC METABOLIC PANEL - Abnormal; Notable for the following components:      Result Value   Glucose, Bld 116 (*)    All other components within normal limits  URINALYSIS, COMPLETE (UACMP) WITH MICROSCOPIC - Abnormal; Notable for the following components:   Color, Urine YELLOW (*)    APPearance CLEAR (*)    All other components within normal limits  FIBRIN DERIVATIVES D-DIMER ([facility] ONLY) - Abnormal; Notable for the following components:   Fibrin derivatives D-dimer [facility]) [id number] (*)    All other components within normal limits  SARS CORONAVIRUS 2 (TAT 6-24 HRS)  CBC  LIPASE, BLOOD  TROPONIN I (HIGH SENSITIVITY)    TROPONIN I (HIGH SENSITIVITY)   ____________________________________________  EKG  EKG read interpreted by me shows normal sinus rhythm rate of 78 normal axis no acute ST-T changes ____________________________________________  RADIOLOGY  ED MD interpretation: Chest x-ray read by radiology reviewed by me shows some bibasilar opacities right knee is more prominent.  Official radiology report(s): DG Chest 2 View  Result Date: 01/23/2019 CLINICAL DATA:  Chest pain. EXAM: CHEST - 2 VIEW COMPARISON:  January 23, 2018 FINDINGS: The heart, hila, and mediastinum are normal. No pneumothorax. Mild bibasilar opacities, right greater than left. Tiny effusions may be present. A calcified granuloma is seen in the left mid lung, stable. No in calcified nodules. No masses. No other  infiltrates. IMPRESSION: Bibasilar opacities, right greater than left, may represent atelectasis or developing infiltrates/pneumonia. Recommend clinical correlation and attention on short-term follow-up. No other acute abnormalities are identified. Electronically Signed   By: Dorise Bullion III M.D   On: 01/23/2019 10:09   CT Angio Chest PE W and/or Wo Contrast  Result Date: 01/23/2019 CLINICAL DATA:  Shortness of breath, no fever denies history of covid EXAM: CT ANGIOGRAPHY CHEST WITH CONTRAST TECHNIQUE: Multidetector CT imaging of the chest was performed using the standard protocol during bolus administration of intravenous contrast. Multiplanar CT image reconstructions and MIPs were obtained to evaluate the vascular anatomy. CONTRAST:  50mL OMNIPAQUE IOHEXOL 350 MG/ML SOLN COMPARISON:  None. FINDINGS: Cardiovascular: There is a optimal opacification of the pulmonary arteries. There is no central,segmental, or subsegmental filling defects within the pulmonary arteries. There is mild cardiomegaly. Coronary artery calcifications are seen. There is normal three-vessel brachiocephalic anatomy without proximal stenosis. Scattered  atherosclerosis is noted within the aorta. No aneurysmal dilatation. Mediastinum/Nodes: There is scattered mildly prominent prevascular and paratracheal lymph nodes. Calcifications are seen within several of these lymph nodes within the paratracheal and right hilar region. Thyroid gland, trachea, and esophagus demonstrate no significant findings. Lungs/Pleura: There is streaky/minimally patchy airspace opacity seen predominantly within the right lower lung and periphery of the posterior left lower lung. Calcified lung granuloma seen in the left lung base. No large airspace consolidation or pleural effusion. Upper Abdomen: No acute abnormalities present in the visualized portions of the upper abdomen. Musculoskeletal: No chest wall abnormality. No acute or significant osseous findings. Review of the MIP images confirms the above findings. IMPRESSION: 1. No central, segmental, or subsegmental pulmonary embolism. 2. Streaky/patchy airspace disease at both lung bases which could be due to early atypical infectious etiology/atelectasis. 3. Findings suggestive of prior granulomatous disease. 4.  Aortic Atherosclerosis (ICD10-I70.0). Electronically Signed   By: Prudencio Pair M.D.   On: 01/23/2019 18:51    ____________________________________________   PROCEDURES  Procedure(s) performed (including Critical Care):  Procedures   ____________________________________________   INITIAL IMPRESSION / ASSESSMENT AND PLAN / ED COURSE  Patient CT angios shows only some markings that appear to be pneumonia.  I will treat him for pneumonia.  It is only in the right side of the base so Covid is less likely although I have sent the Covid test on him as well.    Oswell Brackin Bearse was evaluated in Emergency Department on 01/23/2019 for the symptoms described in the history of present illness. He was evaluated in the context of the global COVID-19 pandemic, which necessitated consideration that the patient might be at risk  for infection with the SARS-CoV-2 virus that causes COVID-19. Institutional protocols and algorithms that pertain to the evaluation of patients at risk for COVID-19 are in a state of rapid change based on information released by regulatory bodies including the CDC and federal and state organizations. These policies and algorithms were followed during the patient's care in the ED.         ____________________________________________   FINAL CLINICAL IMPRESSION(S) / ED DIAGNOSES  Final diagnoses:  Pleuritic chest pain  Community acquired pneumonia of right lung, unspecified part of lung     ED Discharge Orders         Ordered    azithromycin (ZITHROMAX Z-PAK) 250 MG tablet     01/23/19 1902    HYDROcodone-acetaminophen (NORCO/VICODIN) 5-325 MG tablet  Every 6 hours PRN     01/23/19 1902  Note:  This document was prepared using Dragon voice recognition software and may include unintentional dictation errors.    Nena Polio, MD 01/23/19 [phone]

## 2019-01-23 NOTE — ED Triage Notes (Signed)
R chest pain x 2 days,  R back pain began during night.

## 2019-01-24 LAB — SARS CORONAVIRUS 2 (TAT 6-24 HRS): SARS Coronavirus 2: NEGATIVE

## 2019-01-25 ENCOUNTER — Telehealth: Payer: Self-pay

## 2019-01-25 NOTE — Telephone Encounter (Signed)
Tried calling patient. Left message to call back. OK for [facility] to advise patient that appointment has been scheduled for 01/27/2019 at 10am. Make sure patient understands that this visit will have to be a virtual or telephone.

## 2019-01-25 NOTE — Telephone Encounter (Signed)
Pt is aware of appt 01-27-2019 10 am virtual appt

## 2019-01-25 NOTE — Telephone Encounter (Signed)
I havent called the patient yet, but I went ahead and scheduled hospital follow up appointment this Wednesday 01/27/2019 at 10am. Is this time ok? It will be a virtual or telephone visit since patient still has cough symptoms.   From [facility] Copied from [place] [phone]. Topic: Appointment Scheduling - Scheduling Inquiry for Clinic >> Jan 25, 2019  8:26 AM Scherrie Gerlach wrote: Reason for CRM: pt would like hospital follow up appt with Dr Caryn Section on Wed.  Pt dx with PNA.  Pt still has cough. Pt advised to see his pcp in 3 days. Please advise if pt can be seen Wed.

## 2019-01-25 NOTE — Telephone Encounter (Signed)
That's fine. Thanks!

## 2019-01-27 ENCOUNTER — Encounter: Payer: Self-pay | Admitting: Family Medicine

## 2019-01-27 ENCOUNTER — Ambulatory Visit (INDEPENDENT_AMBULATORY_CARE_PROVIDER_SITE_OTHER): Payer: Medicare HMO | Admitting: Family Medicine

## 2019-01-27 DIAGNOSIS — K130 Diseases of lips: Secondary | ICD-10-CM | POA: Diagnosis not present

## 2019-01-27 DIAGNOSIS — R091 Pleurisy: Secondary | ICD-10-CM | POA: Diagnosis not present

## 2019-01-27 MED ORDER — NYSTATIN 100000 UNIT/GM EX OINT: 1.0000 "application " | TOPICAL_OINTMENT | Freq: Two times a day (BID) | CUTANEOUS | 1 refills | Status: DC

## 2019-01-27 MED ORDER — PREDNISONE 10 MG PO TABS: ORAL_TABLET | ORAL | 0 refills | Status: DC

## 2019-01-27 NOTE — Progress Notes (Signed)
Patient: Gregory Arroyo Male    DOB: [birth year]   78 y.o.   MRN: [id number] Visit Date: 01/27/2019  Today's Provider: Lelon Huh, MD   Chief Complaint  Patient presents with  . ER Follow Up   Subjective:    Virtual Visit via Telephone Note  I connected with Gregory Arroyo on 01/27/19 at 10:00 AM EST by telephone and verified that I am speaking with the correct person using two identifiers.  Location: Patient: home Provider: [facility]   I discussed the limitations, risks, security and privacy concerns of performing an evaluation and management service by telephone and the availability of in person appointments. I also discussed with the patient that there may be a patient responsible charge related to this service. The patient expressed understanding and agreed to proceed.  HPI  Follow up ER visit  Patient was seen in ER for sharp pleuritic chest pain in the right lower chest  on 01/23/2019. He had elevated D-Dimer prompting CTA of chest which was only remarkable for patchy airspace opacity R>L.   He was treated for Pleuritic chest pain and community acquired pneumonia of right lung, unspecified part of lung. Treatment for this included started Azithromycin 250 mg and Hydrocodone 5-325 mg. Patient finished Z-pak this morning.  He reports good compliance with treatment. He reports this condition is Unchanged. Patient is still experiencing right chest pain. He denies any coughing shortness of breath or fever.   He also states he still has some soreness on the corners of his mouth which we saw him for last week and treated with OTC petroleum jelly. He was also prescribed valacyclovir for aphthous ulcers. He reports these have improved, but have not yet resolved.   ------------------------------------------------------------------------------------   Allergies  Allergen Reactions  . Pollen Extract      Current Outpatient Medications:  .  aspirin 81 MG tablet, Take 1  tablet by mouth daily., Disp: , Rfl:  .  Cetirizine HCl (ZYRTEC ALLERGY) 10 MG CAPS, Take 1 tablet by mouth daily. , Disp: , Rfl:  .  fluticasone (FLONASE) 50 MCG/ACT nasal spray, Place 2 sprays into both nostrils daily. (Patient taking differently: Place 2 sprays into both nostrils daily. As needed), Disp: 16 g, Rfl: 5 .  HYDROcodone-acetaminophen (NORCO/VICODIN) 5-325 MG tablet, Take 1 tablet by mouth every 6 (six) hours as needed for moderate pain., Disp: 15 tablet, Rfl: 0 .  loratadine (CLARITIN) 10 MG tablet, Take 1 tablet by mouth daily as needed., Disp: , Rfl:  .  Omega-3 Fatty Acids (FISH OIL PO), Take 1 capsule by mouth daily., Disp: , Rfl:  .  azithromycin (ZITHROMAX Z-PAK) 250 MG tablet, Take 2 tablets (500 mg) on  Day 1,  followed by 1 tablet (250 mg) once daily on Days 2 through 5. (Patient not taking: Reported on 01/27/2019), Disp: 6 each, Rfl: 0  Review of Systems  Constitutional: Negative.   HENT: Negative for congestion.   Respiratory: Negative.  Negative for cough and shortness of breath.   Cardiovascular: Positive for chest pain.  Musculoskeletal: Negative.     Social History   Tobacco Use  . Smoking status: Former Smoker    Packs/day: 1.00    Years: 2.00    Pack years: 2.00    Types: Cigarettes    Quit date: 02/04/1958    Years since quitting: 61.0  . Smokeless tobacco: Never Used  Substance Use Topics  . Alcohol use: Yes    Alcohol/week: 7.0  standard drinks    Types: 7 Glasses of wine per week      Objective:   There were no vitals taken for this visit.   Physical Exam  Awake, alert, oriented x 3. In no apparent distress  No results found for any visits on 01/27/19.     Assessment & Plan     1. Pleurisy Findings suggestive of atypical pneumonia on CTA. Patient still with pleuritic chest pain, but no fever, shortness of breath or cough. He did not tolerate hydrocodone/apap prescribed in ER Will start- predniSONE (DELTASONE) 10 MG tablet; 6 tablets for  1 day, then 5 for 1 day, then 4 for 1 day, then 3 for 1 day, then 2 for 1 day then 1 for 1 day.  Dispense: 21 tablet; Refill: 0  2. Angular cheilitis Improved, but not resolved.  - nystatin ointment (MYCOSTATIN); Apply 1 application topically 2 (two) times daily.  Dispense: 15 g; Refill: 1  Call if symptoms change or if not rapidly improving.     I discussed the assessment and treatment plan with the patient. The patient was provided an opportunity to ask questions and all were answered. The patient agreed with the plan and demonstrated an understanding of the instructions.   The patient was advised to call back or seek an in-person evaluation if the symptoms worsen or if the condition fails to improve as anticipated.  I provided 11 minutes of non-face-to-face time during this encounter.      Lelon Huh, MD  [facility]

## 2019-02-08 ENCOUNTER — Telehealth: Payer: Self-pay | Admitting: Family Medicine

## 2019-02-08 DIAGNOSIS — R091 Pleurisy: Secondary | ICD-10-CM

## 2019-02-08 MED ORDER — PREDNISONE 10 MG PO TABS: ORAL_TABLET | ORAL | 0 refills | Status: AC

## 2019-02-08 NOTE — Telephone Encounter (Signed)
I called and spoke with patient. He states the prednisone did help to improve symptoms but didn't resolve them completely. Patient would like a refill sent into pharmacy. Prescription refill has been e-prescribed.

## 2019-02-08 NOTE — Telephone Encounter (Signed)
Pt was dx with pleurisy on 01-27-2019. Pt has finished the medication and now his chest is hurting again. Please advise. [facility] [address] in [place]

## 2019-02-08 NOTE — Telephone Encounter (Signed)
If his pain got better when he was on prednisone, then can send in refill

## 2019-02-23 DIAGNOSIS — R69 Illness, unspecified: Secondary | ICD-10-CM | POA: Diagnosis not present

## 2019-03-22 DIAGNOSIS — R69 Illness, unspecified: Secondary | ICD-10-CM | POA: Diagnosis not present

## 2019-03-24 ENCOUNTER — Encounter: Payer: Self-pay | Admitting: Physician Assistant

## 2019-03-24 ENCOUNTER — Ambulatory Visit (INDEPENDENT_AMBULATORY_CARE_PROVIDER_SITE_OTHER): Payer: Medicare HMO | Admitting: Physician Assistant

## 2019-03-24 ENCOUNTER — Other Ambulatory Visit: Payer: Self-pay

## 2019-03-24 VITALS — BP 134/86 | HR 75 | Temp 96.2°F | Wt 154.4 lb

## 2019-03-24 DIAGNOSIS — R079 Chest pain, unspecified: Secondary | ICD-10-CM

## 2019-03-24 DIAGNOSIS — M109 Gout, unspecified: Secondary | ICD-10-CM

## 2019-03-24 DIAGNOSIS — R208 Other disturbances of skin sensation: Secondary | ICD-10-CM

## 2019-03-24 MED ORDER — COLCHICINE 0.6 MG PO TABS: ORAL_TABLET | ORAL | 0 refills | Status: DC

## 2019-03-24 NOTE — Patient Instructions (Addendum)
Mix together maalox liquid benadryl in 1:1, swish and spit.    Gout  Gout is painful swelling of your joints. Gout is a type of arthritis. It is caused by having too much uric acid in your body. Uric acid is a chemical that is made when your body breaks down substances called purines. If your body has too much uric acid, sharp crystals can form and build up in your joints. This causes pain and swelling. Gout attacks can happen quickly and be very painful (acute gout). Over time, the attacks can affect more joints and happen more often (chronic gout). What are the causes?  Too much uric acid in your blood. This can happen because: ? Your kidneys do not remove enough uric acid from your blood. ? Your body makes too much uric acid. ? You eat too many foods that are high in purines. These foods include organ meats, some seafood, and beer.  Trauma or stress. What increases the risk?  Having a family history of gout.  Being male and middle-aged.  Being male and having gone through menopause.  Being very overweight (obese).  Drinking alcohol, especially beer.  Not having enough water in the body (being dehydrated).  Losing weight too quickly.  Having an organ transplant.  Having lead poisoning.  Taking certain medicines.  Having kidney disease.  Having a skin condition called psoriasis. What are the signs or symptoms? An attack of acute gout usually happens in just one joint. The most common place is the big toe. Attacks often start at night. Other joints that may be affected include joints of the feet, ankle, knee, fingers, wrist, or elbow. Symptoms of an attack may include:  Very bad pain.  Warmth.  Swelling.  Stiffness.  Shiny, red, or purple skin.  Tenderness. The affected joint may be very painful to touch.  Chills and fever. Chronic gout may cause symptoms more often. More joints may be involved. You may also have white or yellow lumps (tophi) on your hands or  feet or in other areas near your joints. How is this treated?  Treatment for this condition has two phases: treating an acute attack and preventing future attacks.  Acute gout treatment may include: ? NSAIDs. ? Steroids. These are taken by mouth or injected into a joint. ? Colchicine. This medicine relieves pain and swelling. It can be given by mouth or through an IV tube.  Preventive treatment may include: ? Taking small doses of NSAIDs or colchicine daily. ? Using a medicine that reduces uric acid levels in your blood. ? Making changes to your diet. You may need to see a food expert (dietitian) about what to eat and drink to prevent gout. Follow these instructions at home: During a gout attack   If told, put ice on the painful area: ? Put ice in a plastic bag. ? Place a towel between your skin and the bag. ? Leave the ice on for 20 minutes, 2-3 times a day.  Raise (elevate) the painful joint above the level of your heart as often as you can.  Rest the joint as much as possible. If the joint is in your leg, you may be given crutches.  Follow instructions from your doctor about what you cannot eat or drink. Avoiding future gout attacks  Eat a low-purine diet. Avoid foods and drinks such as: ? Liver. ? Kidney. ? Anchovies. ? Asparagus. ? Herring. ? Mushrooms. ? Mussels. ? Beer.  Stay at a healthy weight. If  you want to lose weight, talk with your doctor. Do not lose weight too fast.  Start or continue an exercise plan as told by your doctor. Eating and drinking  Drink enough fluids to keep your pee (urine) pale yellow.  If you drink alcohol: ? Limit how much you use to:  0-1 drink a day for women.  0-2 drinks a day for men. ? Be aware of how much alcohol is in your drink. In the [place] one drink equals one 12 oz bottle of beer (355 mL), one 5 oz glass of wine (148 mL), or one 1 oz glass of hard liquor (44 mL). General instructions  Take over-the-counter and  prescription medicines only as told by your doctor.  Do not drive or use heavy machinery while taking prescription pain medicine.  Return to your normal activities as told by your doctor. Ask your doctor what activities are safe for you.  Keep all follow-up visits as told by your doctor. This is important. Contact a doctor if:  You have another gout attack.  You still have symptoms of a gout attack after 10 days of treatment.  You have problems (side effects) because of your medicines.  You have chills or a fever.  You have burning pain when you pee (urinate).  You have pain in your lower back or belly. Get help right away if:  You have very bad pain.  Your pain cannot be controlled.  You cannot pee. Summary  Gout is painful swelling of the joints.  The most common site of pain is the big toe, but it can affect other joints.  Medicines and avoiding some foods can help to prevent and treat gout attacks. This information is not intended to replace advice given to you by your health care provider. Make sure you discuss any questions you have with your health care provider. Document Revised: 08/13/2017 Document Reviewed: 08/13/2017 Elsevier Patient Education  [place].

## 2019-03-24 NOTE — Progress Notes (Addendum)
Patient: Gregory Arroyo Male    DOB: 1940/05/25   79 y.o.   MRN: [id number] Visit Date: 03/24/2019  Today's Provider: Trinna Post, PA-C   Chief Complaint  Patient presents with  . finger swelling  . Bruised chest   Subjective:     HPI   Patient presents today for finger swelling, mouth burning, and chest bruising. Finger is red, swollen and feverish. Patient was seen last month by Dr. Caryn Section for a similar thing and was prescribed prednisone and bruising went away but it came back. Patient noticed mouth burning when using tooth paste.   Treated for pneumonia in ER on 01/23/2019 with azithromycin. Saw PCP on 01/27/2019 and was sent prednisone taper. Received another prednisone taper on 02/08/2019.   Gregory Arroyo now has some chest soreness on the right side. Not worse when Gregory Arroyo is breathing, No fevers, or chills. On occasion with a very deep breath. Hurts with pressure. No chest pain. No vomiting.   Right index figer DIP is red and swollen. No drainage, came up suddenly. No fevers, chills, nausea, vomiting.   Reports his mouth was burning this morning when Gregory Arroyo brushed his teeth but has since stopped. Denies new toothpaste or mouth rinse.    Allergies  Allergen Reactions  . Pollen Extract      Current Outpatient Medications:  .  aspirin 81 MG tablet, Take 1 tablet by mouth daily., Disp: , Rfl:  .  Cetirizine HCl (ZYRTEC ALLERGY) 10 MG CAPS, Take 1 tablet by mouth daily. , Disp: , Rfl:  .  fluticasone (FLONASE) 50 MCG/ACT nasal spray, Place 2 sprays into both nostrils daily. (Patient taking differently: Place 2 sprays into both nostrils daily. As needed), Disp: 16 g, Rfl: 5 .  HYDROcodone-acetaminophen (NORCO/VICODIN) 5-325 MG tablet, Take 1 tablet by mouth every 6 (six) hours as needed for moderate pain., Disp: 15 tablet, Rfl: 0 .  loratadine (CLARITIN) 10 MG tablet, Take 1 tablet by mouth daily as needed., Disp: , Rfl:  .  nystatin ointment (MYCOSTATIN), Apply 1 application  topically 2 (two) times daily., Disp: 15 g, Rfl: 1 .  Omega-3 Fatty Acids (FISH OIL PO), Take 1 capsule by mouth daily., Disp: , Rfl:   Review of Systems  Social History   Tobacco Use  . Smoking status: Former Smoker    Packs/day: 1.00    Years: 2.00    Pack years: 2.00    Types: Cigarettes    Quit date: 02/04/1958    Years since quitting: 61.1  . Smokeless tobacco: Never Used  Substance Use Topics  . Alcohol use: Yes    Alcohol/week: 7.0 standard drinks    Types: 7 Glasses of wine per week      Objective:   There were no vitals taken for this visit. There were no vitals filed for this visit.There is no height or weight on file to calculate BMI.   Physical Exam Constitutional:      Appearance: Normal appearance.  HENT:     Mouth/Throat:     Lips: No lesions.     Mouth: Mucous membranes are moist.     Dentition: Normal dentition.     Tongue: No lesions.     Pharynx: Oropharynx is clear. No oropharyngeal exudate or posterior oropharyngeal erythema.     Tonsils: No tonsillar exudate.  Cardiovascular:     Rate and Rhythm: Normal rate and regular rhythm.     Heart sounds: Normal heart sounds.  Pulmonary:  Effort: Pulmonary effort is normal.     Breath sounds: Normal breath sounds.  Chest:     Chest wall: Tenderness present.       Comments: Reproducible chest tenderness in area of concern.  Musculoskeletal:     Comments: DIP joints on bilateral hands are swollen and nodular at baseline. His right index finger DIP is comparatively more swollen, red and tender to the touch.   Skin:    General: Skin is warm and dry.  Neurological:     Mental Status: Gregory Arroyo is alert and oriented to person, place, and time. Mental status is at baseline.  Psychiatric:        Mood and Affect: Mood normal.        Behavior: Behavior normal.      No results found for any visits on 03/24/19.     Assessment & Plan    1. Chest pain, unspecified type  Consistent with MSK strain.  2.  Acute gout of right hand, unspecified cause  Treat as below. Follow up if worsening.   Patient called back on 03/25/2019 after taking colchicine saying finger is not improved. Will send in prednisone 40 mg QD x 5 days and order xray of finger.   - colchicine 0.6 MG tablet; Take two tablets (1.2 mg ) at first sign of gout. Then, one hour later take 1 tablet.  Dispense: 30 tablet; Refill: 0  3. Burning sensation of mouth  Uncertain as to what's causing this. Offered magic mouthwash though patient reports this is not covered by insurance and can be cost prohibitive. Since it has resolved and exam is benign, we will continue to observe.   The entirety of the information documented in the History of Present Illness, Review of Systems and Physical Exam were personally obtained by me. Portions of this information were initially documented by [facility] and reviewed by me for thoroughness and accuracy.   F/u PRN     Trinna Post, PA-C  [facility]

## 2019-03-25 ENCOUNTER — Telehealth: Payer: Self-pay | Admitting: Family Medicine

## 2019-03-25 DIAGNOSIS — L03019 Cellulitis of unspecified finger: Secondary | ICD-10-CM | POA: Diagnosis not present

## 2019-03-25 MED ORDER — PREDNISONE 20 MG PO TABS: 40.0000 mg | ORAL_TABLET | Freq: Every day | ORAL | 0 refills | Status: AC

## 2019-03-25 NOTE — Addendum Note (Signed)
Addended by: Trinna Post on: 03/25/2019 01:58 PM   Modules accepted: Orders, Level of Service

## 2019-03-25 NOTE — Telephone Encounter (Signed)
If he took the colchicine that way and it is gout, that really should have helped it. Sometimes an infection of joint can present similarly to gout. Since he is feeling that much worse, I would recommend he be seen at [facility] urgent care. They can xray him there and also take a fluid sample from the joint if necessary.

## 2019-03-25 NOTE — Telephone Encounter (Signed)
Patient reports that pain is worse even after taking 3 tablets of colchicine last night and 3 tablets today. Patient reports that area is larger and has redness and hot to touch. Patient reports that he can get x-ray tomorrow. The out patient imaging is closed today.

## 2019-03-25 NOTE — Telephone Encounter (Signed)
Pt states he was seen yesterday and given colchicine 0.6 MG tablet for gout.  Pt states that he feels his problem is getting worse not better and wants to know what to do.

## 2019-03-25 NOTE — Telephone Encounter (Signed)
Worse in what way? The area is larger, redder or more painful? f he took the two tablets initially and then the one following as instructed then we can change the medication to prednisone 40 mg x 5 days. I will also order an xray of his hand to assess further for gout and other issues.

## 2019-03-25 NOTE — Telephone Encounter (Signed)
Patient called back in he still hasnt heard anything from office and would like a call asap . Please advise

## 2019-03-25 NOTE — Telephone Encounter (Signed)
Patient advised as below. Patient verbalizes understanding and is in agreement with treatment plan.  

## 2019-03-31 DIAGNOSIS — L03019 Cellulitis of unspecified finger: Secondary | ICD-10-CM | POA: Diagnosis not present

## 2019-04-19 ENCOUNTER — Other Ambulatory Visit: Payer: Self-pay

## 2019-04-19 ENCOUNTER — Encounter: Payer: Self-pay | Admitting: Dermatology

## 2019-04-19 ENCOUNTER — Ambulatory Visit: Payer: Medicare HMO | Admitting: Dermatology

## 2019-04-19 DIAGNOSIS — L578 Other skin changes due to chronic exposure to nonionizing radiation: Secondary | ICD-10-CM | POA: Diagnosis not present

## 2019-04-19 DIAGNOSIS — L821 Other seborrheic keratosis: Secondary | ICD-10-CM

## 2019-04-19 DIAGNOSIS — D1801 Hemangioma of skin and subcutaneous tissue: Secondary | ICD-10-CM

## 2019-04-19 DIAGNOSIS — Z1283 Encounter for screening for malignant neoplasm of skin: Secondary | ICD-10-CM | POA: Diagnosis not present

## 2019-04-19 DIAGNOSIS — Z85828 Personal history of other malignant neoplasm of skin: Secondary | ICD-10-CM | POA: Diagnosis not present

## 2019-04-19 DIAGNOSIS — L814 Other melanin hyperpigmentation: Secondary | ICD-10-CM | POA: Diagnosis not present

## 2019-04-19 DIAGNOSIS — L820 Inflamed seborrheic keratosis: Secondary | ICD-10-CM

## 2019-04-19 NOTE — Assessment & Plan Note (Addendum)
[place] R antihelix 10/2018- most recent skin cancer, recheck in 6 months

## 2019-04-19 NOTE — Patient Instructions (Addendum)
Cryotherapy Aftercare  . Wash gently with soap and water everyday.   . Apply Vaseline and Band-Aid daily until healed.  

## 2019-04-19 NOTE — Progress Notes (Signed)
   Follow-Up Visit   Subjective  Gregory Arroyo is a 79 y.o. male who presents for the following: 65m f/u UBSE (Hx BCC, SCC).  The following portions of the chart were reviewed this encounter and updated as appropriate: Tobacco  Allergies  Meds  Problems  Med Hx  Surg Hx  Fam Hx      Review of Systems: No other skin or systemic complaints.  Objective  Well appearing patient in no apparent distress; mood and affect are within normal limits.  A focused examination was performed including UBSE. Relevant physical exam findings are noted in the Assessment and Plan.  Objective  R mid cheek x 1: Erythematous keratotic or waxy stuck-on papule or plaque- irritated by mask.   Assessment & Plan  Actinic skin damage Scalp, face  Recommend broad spectrum SPF 30 or greater.  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

## 2019-05-04 ENCOUNTER — Ambulatory Visit: Payer: Medicare HMO | Admitting: Family Medicine

## 2019-05-06 DIAGNOSIS — M170 Bilateral primary osteoarthritis of knee: Secondary | ICD-10-CM | POA: Diagnosis not present

## 2019-05-28 ENCOUNTER — Ambulatory Visit (INDEPENDENT_AMBULATORY_CARE_PROVIDER_SITE_OTHER): Payer: Medicare HMO | Admitting: Family Medicine

## 2019-05-28 ENCOUNTER — Encounter: Payer: Self-pay | Admitting: Family Medicine

## 2019-05-28 ENCOUNTER — Other Ambulatory Visit: Payer: Self-pay

## 2019-05-28 VITALS — BP 124/75 | HR 74 | Temp 96.9°F | Ht 66.0 in | Wt 149.6 lb

## 2019-05-28 DIAGNOSIS — M940 Chondrocostal junction syndrome [Tietze]: Secondary | ICD-10-CM | POA: Diagnosis not present

## 2019-05-28 DIAGNOSIS — K120 Recurrent oral aphthae: Secondary | ICD-10-CM

## 2019-05-28 MED ORDER — PREDNISONE 10 MG PO TABS: ORAL_TABLET | ORAL | 0 refills | Status: AC

## 2019-05-28 MED ORDER — VALACYCLOVIR HCL 1 G PO TABS: ORAL_TABLET | ORAL | 1 refills | Status: DC

## 2019-05-28 NOTE — Progress Notes (Signed)
Established patient visit   Patient: Gregory Arroyo   DOB: May 31, 1940   79 y.o. Male  MRN: [id number] Visit Date: 05/28/2019  Today's healthcare provider: Lelon Huh, MD   Chief Complaint  Patient presents with  . Oral Pain  . Chest Pain   Subjective    HPI Patient presents today in office for tenderness in mouth and chest pain, that he says this is identical to pain he had in December when he went to ER and negative cardiac workup, but chest CTA revealed: Streaky/patchy airspace disease at both lung bases which could be due to early atypical infectious etiology/atelectasis. Findings suggestive of prior granulomatous disease  He was treated with azithromycin at that time which didn't help, but states the prednisone that I prescribed at his follow up visit resolve his symptoms. He states it started flaring back up a few weeks ago. No dyspnea, no exertional pain. no dizziness. No cough, no fever.  Patient says that if he eats anything that has acid it burns his lips. He was previously treated with magic mouthwash which helped a little bit, but sores keep coming back.      Medications: Outpatient Medications Prior to Visit  Medication Sig  . aspirin 81 MG tablet Take 1 tablet by mouth daily.  . fluticasone (FLONASE) 50 MCG/ACT nasal spray Place 2 sprays into both nostrils daily. (Patient taking differently: Place 2 sprays into both nostrils daily. As needed)  . loratadine (CLARITIN) 10 MG tablet Take 1 tablet by mouth daily as needed.  . Omega-3 Fatty Acids (FISH OIL PO) Take 1 capsule by mouth daily.  . Cetirizine HCl (ZYRTEC ALLERGY) 10 MG CAPS Take 1 tablet by mouth daily.   . colchicine 0.6 MG tablet Take two tablets (1.2 mg ) at first sign of gout. Then, one hour later take 1 tablet. (Patient not taking: Reported on 05/28/2019)   No facility-administered medications prior to visit.    Review of Systems  Cardiovascular: Positive for chest pain.       Objective      BP 124/75 (BP Location: Left Arm, Patient Position: Sitting, Cuff Size: Normal)   Pulse 74   Temp (!) 96.9 F (36.1 C) (Temporal)   Ht 5\' 6"  (1.676 m)   Wt 149 lb 9.6 oz (67.9 kg)   SpO2 96%   BMI 24.15 kg/m    Physical Exam   General: Appearance:    Well developed, well nourished male in no acute distress  ENT:    Small aphthous ulcers of lower lip and left buccal mucosa   Eyes:    PERRL, conjunctiva/corneas clear, EOM's intact       Lungs:     Clear to auscultation bilaterally, respirations unlabored  Heart:    Normal heart rate. Normal rhythm. No murmurs, rubs, or gallops.   MS:   All extremities are intact. Chest pain reproduced with palpation of upper outer chest wall. No deformities or lesions.   Neurologic:   Awake, alert, oriented x 3. No apparent focal neurological           defect.         No results found for any visits on 05/28/19.  Assessment & Plan    1. Costochondritis  - predniSONE (DELTASONE) 10 MG tablet; 6 tablets for 2 days, then 5 for 2 days, then 4 for 2 days, then 3 for 2 days, then 2 for 2 days, then 1 for 2 days.  Dispense: 42 tablet; Refill:  0  2. Aphthous ulcer  - valACYclovir (VALTREX) 1000 MG tablet; 2 tablets twice a day for 1 day as needed for cold sores  Dispense: 30 tablet; Refill: 1   No follow-ups on file.      The entirety of the information documented in the History of Present Illness, Review of Systems and Physical Exam were personally obtained by me. Portions of this information were initially documented by the CMA and reviewed by me for thoroughness and accuracy.      Lelon Huh, MD  [facility] [phone] (phone) [phone] (fax)  [place]

## 2019-06-15 DIAGNOSIS — R69 Illness, unspecified: Secondary | ICD-10-CM | POA: Diagnosis not present

## 2019-06-18 ENCOUNTER — Ambulatory Visit (INDEPENDENT_AMBULATORY_CARE_PROVIDER_SITE_OTHER): Payer: Medicare HMO | Admitting: Family Medicine

## 2019-06-18 ENCOUNTER — Encounter: Payer: Self-pay | Admitting: Family Medicine

## 2019-06-18 ENCOUNTER — Other Ambulatory Visit: Payer: Self-pay

## 2019-06-18 VITALS — BP 140/81 | HR 85 | Temp 97.3°F | Resp 16 | Wt 144.0 lb

## 2019-06-18 DIAGNOSIS — K140 Glossitis: Secondary | ICD-10-CM

## 2019-06-18 DIAGNOSIS — B002 Herpesviral gingivostomatitis and pharyngotonsillitis: Secondary | ICD-10-CM | POA: Diagnosis not present

## 2019-06-18 DIAGNOSIS — R5383 Other fatigue: Secondary | ICD-10-CM

## 2019-06-18 MED ORDER — VALACYCLOVIR HCL 1 G PO TABS: 1000.0000 mg | ORAL_TABLET | Freq: Two times a day (BID) | ORAL | 0 refills | Status: AC

## 2019-06-18 NOTE — Progress Notes (Signed)
See note from Daniel Le, medical student, attested by me from same date of service  

## 2019-06-18 NOTE — Patient Instructions (Signed)
Stomatitis Stomatitis is a condition that causes swelling (inflammation) in your mouth. It can affect all or part of the inside of the mouth. The condition often affects your cheek, teeth, gums, lips, and tongue. It can also affect the tissues that produce mucus in your mouth (mucosa). Pain from stomatitis can make it hard for you to eat or drink. Very bad cases of this condition can lead to:  Not getting enough fluid in your body (dehydration).  Poor nutrition. Follow these instructions at home: Medicines  Take over-the-counter and prescription medicines only as told by your doctor.  If you were given an antibiotic medicine, take it as told by your doctor. Do not stop taking the medicine even if you start to feel better.  Do not use products that contain benzocaine in children who are younger than 2 years.  Do not drive or use heavy machinery while taking prescription pain medicine. Eating and drinking  Eat a balanced diet.  Do not eat: ? Spicy foods. ? Citrus, such as oranges. ? Foods that have sharp edges, such as chips.  Do not eat any foods that you think may be causing this condition.  Do not drink alcohol.  Drink enough fluid to keep your pee (urine) pale yellow. This will keep you hydrated. Lifestyle      Take good care of your mouth and teeth (oral hygiene): ? Gently brush your teeth with a soft toothbrush. Do this 2 times each day. ? Floss your teeth every day. ? Have your teeth cleaned regularly. Do this as told by your dentist.  If you have dentures, make sure that they fit the way that they should.  Do not use any products that contain nicotine or tobacco, such as cigarettes and e-cigarettes. If you need help quitting, ask your doctor.  Find ways to lower your stress. Try yoga or meditation. Ask your doctor for other ideas. General instructions  Use a salt-water rinse for pain as told by your doctor.  Keep all follow-up visits as told by your doctor.  This is important. Contact a doctor if:  Your symptoms get worse.  You have new symptoms, like: ? A rash. ? New symptoms that do not involve your mouth area.  Your symptoms last longer than 3 weeks.  Your symptoms go away and then come back.  You are more tired.  You feel weaker.  You stop feeling hungry.  You feel sick to your stomach (nauseous). Get help right away if you:  Have a fever.  Are not able to eat or drink.  Have a lot of bleeding in your mouth. Summary  Stomatitis is a condition that causes swelling in your mouth.  Pain from stomatitis can make it hard for you to eat or drink. Very bad cases of this condition can lead to not getting enough fluid in your body or poor nutrition.  Take medicines only as told by your doctor.  Follow instructions from your doctor on diet and lifestyle changes to help manage your symptoms. This information is not intended to replace advice given to you by your health care provider. Make sure you discuss any questions you have with your health care provider. Document Revised: 02/18/2017 Document Reviewed: 02/18/2017 Elsevier Patient Education  2020 [employer].

## 2019-06-18 NOTE — Progress Notes (Addendum)
Established patient visit   Patient: Gregory Arroyo   DOB: 1940/11/24   79 y.o. Male  MRN: [id number] Visit Date: 06/18/2019  Today's healthcare provider: Lavon Paganini, MD   Chief Complaint  Patient presents with  . Follow-up   Subjective    HPI  Follow up for Mouth Ulcer  The patient was last seen for this 3 weeks ago. Changes made at last visit include Valtrex.  He reports excellent compliance with treatment. Pt is currently not taking medication any more because symptoms were not improving He feels that condition is Worse.  Symptoms began in 3 months ago Pt states bottom lip is hypersensitive  Pt states symptoms of burning and painful Pt had a history of thrush 1 month ago after dental procedures Used magic mouthwash without any improvement of symptoms Pt states that eating worsens the pain Pt denies anything that help improve symptoms   ------------------------------------------------------------------------------------   Fatigue  Symptoms started around 3 months ago along with numbness in bottom lip Appetite is decreased, pt states that he is not hungry but has no problem tolerating food Pt states he feels fatigued as the day goes on. Pt states symptoms worsen by the afternoon and fatigue is very unusual to him Pt has unintentional weight loss: Pt was 150 lbs two weeks; today in office 144 Pt also has Looser bowels that started 1 week ago Pt states that whole body feels sore and achey Muscle aches get worse throughout the day associated with fatigue Pt states he has generalized weakness  Denies fever, recent illness, changes to urination, nausea, vomiting, stomach pain, difficulty breathing, chest pains, palpitations, abdominal pain, heat or cold intolerance, polydipsia or polyphagia, joint swelling or stiffness, dizziness, numbness, syncope, adenopathy,        ------------------------------------------------------------------------------------      Social History   Tobacco Use  . Smoking status: Former Smoker    Packs/day: 1.00    Years: 2.00    Pack years: 2.00    Types: Cigarettes    Quit date: 02/04/1958    Years since quitting: 61.4  . Smokeless tobacco: Never Used  Substance Use Topics  . Alcohol use: Yes    Alcohol/week: 7.0 standard drinks    Types: 7 Glasses of wine per week  . Drug use: No   Social History   Socioeconomic History  . Marital status: Married    Spouse name: Not on file  . Number of children: 4  . Years of education: Not on file  . Highest education level: Associate degree: occupational, Hotel manager, or vocational program  Occupational History  . 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 Smoking status: Former Smoker    Packs/day: 1.00    Years: 2.00    Pack years: 2.00    Types: Cigarettes    Quit date: 02/04/1958    Years since quitting: 61.4  . Smokeless tobacco: Never Used  Substance and Sexual Activity  . Alcohol use: Yes    Alcohol/week: 7.0 standard drinks    Types: 7 Glasses of wine per week  . Drug use: No  . Sexual activity: Not on file  Other Topics Concern  . Not on file  Social History Narrative  . Not on file   Social Determinants of Health   Financial Resource Strain:   . Difficulty of Paying Living Expenses:   Food Insecurity:   .  Worried About Charity fundraiser in the Last Year:   . Arboriculturist in the Last Year:   Transportation Needs:   . Film/video editor (Medical):   Marland Kitchen Lack of Transportation (Non-Medical):   Physical Activity: Inactive  . Days of Exercise per Week: 0 days  . Minutes of Exercise per Session: 0 min  Stress:   . Feeling of Stress :   Social Connections: Unknown  . Frequency of Communication with Friends and Family: Patient  refused  . Frequency of Social Gatherings with Friends and Family: Patient refused  . Attends Religious Services: Patient refused  . Active Member of Clubs or Organizations: Patient refused  . Attends Archivist Meetings: Patient refused  . Marital Status: Patient refused  Intimate Partner Violence: Unknown  . Fear of Current or Ex-Partner: Patient refused  . Emotionally Abused: Patient refused  . Physically Abused: Patient refused  . Sexually Abused: Patient refused       Medications: Outpatient Medications Prior to Visit  Medication Sig  . aspirin 81 MG tablet Take 1 tablet by mouth daily.  . Cetirizine HCl (ZYRTEC ALLERGY) 10 MG CAPS Take 1 tablet by mouth daily.   . fluticasone (FLONASE) 50 MCG/ACT nasal spray Place 2 sprays into both nostrils daily. (Patient taking differently: Place 2 sprays into both nostrils daily. As needed)  . loratadine (CLARITIN) 10 MG tablet Take 1 tablet by mouth daily as needed.  . Omega-3 Fatty Acids (FISH OIL PO) Take 1 capsule by mouth daily.  . valACYclovir (VALTREX) 1000 MG tablet 2 tablets twice a day for 1 day as needed for cold sores (Patient not taking: Reported on 06/18/2019)  . [DISCONTINUED] colchicine 0.6 MG tablet Take two tablets (1.2 mg ) at first sign of gout. Then, one hour later take 1 tablet. (Patient not taking: Reported on 05/28/2019)  . [DISCONTINUED] Omega-3 Fatty Acids (FISH OIL) 1000 MG CAPS Fish Oil 1,000 mg (120 mg-180 mg) capsule   No facility-administered medications prior to visit.    Review of Systems  Constitutional: Positive for appetite change, fatigue and unexpected weight change. Negative for activity change, chills, diaphoresis and fever.  HENT: Positive for mouth sores. Negative for congestion, dental problem, drooling, ear discharge, ear pain, facial swelling, hearing loss, nosebleeds, postnasal drip, rhinorrhea, sinus pressure, sinus pain, sneezing, sore throat, tinnitus, trouble swallowing and voice  change.   Eyes: Negative.   Respiratory: Negative.   Cardiovascular: Negative.   Gastrointestinal: Negative.   Endocrine: Negative.   Genitourinary: Negative.   Musculoskeletal: Positive for myalgias. Negative for arthralgias, back pain, gait problem, joint swelling, neck pain and neck stiffness.  Skin: Negative.   Allergic/Immunologic: Negative.   Neurological: Negative.   Hematological: Negative.   Psychiatric/Behavioral: Negative.     Last CBC Lab Results  Component Value Date   WBC 7.5 01/23/2019   HGB 15.7 01/23/2019   HCT 47.3 01/23/2019   MCV 95.0 01/23/2019   MCH 31.5 01/23/2019   RDW 12.4 01/23/2019   PLT 321 [id number]   Last metabolic panel Lab Results  Component Value Date   GLUCOSE 116 (H) 01/23/2019   NA 138 01/23/2019   K 4.1 01/23/2019   CL 104 01/23/2019   CO2 23 01/23/2019   BUN 18 01/23/2019   CREATININE 1.00 01/23/2019   GFRNONAA >60 01/23/2019   GFRAA >60 01/23/2019   CALCIUM 9.4 01/23/2019   PROT 7.7 09/28/2015   ALBUMIN 3.9 09/28/2015   BILITOT 0.6 09/28/2015   ALKPHOS  51 09/28/2015   AST 38 09/28/2015   ALT 48 09/28/2015   ANIONGAP 11 01/23/2019   Last lipids Lab Results  Component Value Date   CHOL 200 (H) 11/17/2017   HDL 39 (L) 11/17/2017   LDLCALC 96 11/17/2017   TRIG 327 (H) 11/17/2017   CHOLHDL 5.1 (H) 11/17/2017   Last hemoglobin A1c Lab Results  Component Value Date   HGBA1C 6.1 (H) 11/17/2017   Last thyroid functions No results found for: TSH, T3TOTAL, T4TOTAL, THYROIDAB Last vitamin D No results found for: 25OHVITD2, 25OHVITD3, VD25OH Last vitamin B12 and Folate No results found for: VITAMINB12, FOLATE    Objective    BP 140/81 (BP Location: Right Arm, Patient Position: Sitting, Cuff Size: Large)   Pulse 85   Temp (!) 97.3 F (36.3 C) (Temporal)   Resp 16   Wt 144 lb (65.3 kg)   BMI 23.24 kg/m  BP Readings from Last 3 Encounters:  06/18/19 140/81  05/28/19 124/75  03/24/19 134/86   Wt Readings from  Last 3 Encounters:  06/18/19 144 lb (65.3 kg)  05/28/19 149 lb 9.6 oz (67.9 kg)  03/24/19 154 lb 6.4 oz (70 kg)      Physical Exam Constitutional:      Appearance: Normal appearance.  HENT:     Head: Normocephalic and atraumatic.     Salivary Glands: Right salivary gland is not diffusely enlarged or tender. Left salivary gland is not diffusely enlarged or tender.     Right Ear: Tympanic membrane, ear canal and external ear normal.     Left Ear: Tympanic membrane, ear canal and external ear normal.     Nose: Nose normal. No congestion or rhinorrhea.     Right Sinus: No maxillary sinus tenderness or frontal sinus tenderness.     Left Sinus: No maxillary sinus tenderness or frontal sinus tenderness.     Mouth/Throat:     Lips: Pink. Lesions present.     Mouth: Mucous membranes are moist. Oral lesions present. No injury, lacerations or angioedema.     Dentition: Does not have dentures. Gingival swelling and gum lesions present. No dental tenderness, dental caries or dental abscesses.     Palate: No mass and lesions.     Pharynx: Oropharynx is clear. Uvula midline. No pharyngeal swelling or oropharyngeal exudate.     Comments: Tongue with glossitis Eyes:     General: No scleral icterus.       Right eye: No discharge.        Left eye: No discharge.     Extraocular Movements: Extraocular movements intact.     Conjunctiva/sclera: Conjunctivae normal.  Cardiovascular:     Rate and Rhythm: Normal rate and regular rhythm.     Pulses: Normal pulses.     Heart sounds: Normal heart sounds.  Pulmonary:     Effort: Pulmonary effort is normal.     Breath sounds: Normal breath sounds.  Abdominal:     General: Abdomen is flat. Bowel sounds are normal. There is no distension.     Palpations: Abdomen is soft. There is no mass.     Tenderness: There is no abdominal tenderness. There is no guarding.     Hernia: No hernia is present.  Musculoskeletal:        General: Normal range of motion.      Cervical back: Normal range of motion and neck supple. No rigidity or tenderness.     Right lower leg: No edema.     Left  lower leg: No edema.  Lymphadenopathy:     Cervical: No cervical adenopathy.  Skin:    General: Skin is warm and dry.     Capillary Refill: Capillary refill takes less than 2 seconds.  Neurological:     General: No focal deficit present.     Mental Status: He is alert and oriented to person, place, and time.     Cranial Nerves: No cranial nerve deficit.     Sensory: No sensory deficit.     Motor: No weakness.     Deep Tendon Reflexes: Reflexes normal.  Psychiatric:        Mood and Affect: Mood normal.        Behavior: Behavior normal.      No results found for any visits on 06/18/19.  Assessment & Plan     1. Herpetic gingivostomatitis Pt with 3 month history mouth ulcers that is worsening Pt was previously on a short dose of valtrex Physical Exam shows ulceration on the oral mucus membranes and lower lips Symptoms most consistent with herpetic gingivostomatitis Symptoms may also be complicated by 0000000 deficiency given glossitis  Plan: Will start on 10 day course of valtrex Will check B-12 level  2. Fatigue, unspecified type Pt with 3 month history of fatigue of unknown etiology Pt with associated constitutional symptoms of decreased appetite, weight loss, and looser bowel movements Symptoms most likely due to B-12 deficiency given glossitis found on exam Decreased appetite and weight loss maybe related to gingivostomatitis and pain while eating Constitutional symptoms may be concerning for systemic pathology, but unlikely  Plan: Will check CBC for evidence of blood abnormalities Will check CMP for evidence of electrolyte, kidney, and liver abnormalities Will check TSH for evidence of thyroid related causes Will check B12 and Vit D for evidence of vitamin deficiency related causes      Return in about 8 weeks (around 08/13/2019) for follow up on  fatigue and weight loss.      Chipper Herb, Medical Student [facility] [phone] (phone) [phone] (fax)  [place]   Patient seen along with MS3 student Chipper Herb. I personally evaluated this patient along with the student, and verified all aspects of the history, physical exam, and medical decision making as documented by the student. I agree with the student's documentation and have made all necessary edits.  Bacigalupo, Dionne Bucy, MD, MPH [facility]

## 2019-06-19 LAB — COMPREHENSIVE METABOLIC PANEL
ALT: 16 IU/L (ref 0–44)
AST: 27 IU/L (ref 0–40)
Albumin/Globulin Ratio: 1.1 — ABNORMAL LOW (ref 1.2–2.2)
Albumin: 3.8 g/dL (ref 3.7–4.7)
Alkaline Phosphatase: 63 IU/L (ref 39–117)
BUN/Creatinine Ratio: 15 (ref 10–24)
BUN: 14 mg/dL (ref 8–27)
Bilirubin Total: 0.2 mg/dL (ref 0.0–1.2)
CO2: 24 mmol/L (ref 20–29)
Calcium: 9.6 mg/dL (ref 8.6–10.2)
Chloride: 101 mmol/L (ref 96–106)
Creatinine, Ser: 0.93 mg/dL (ref 0.76–1.27)
GFR calc Af Amer: 90 mL/min/{1.73_m2} (ref >59)
GFR calc non Af Amer: 78 mL/min/{1.73_m2} (ref >59)
Globulin, Total: 3.4 g/dL (ref 1.5–4.5)
Glucose: 77 mg/dL (ref 65–99)
Potassium: 4.2 mmol/L (ref 3.5–5.2)
Sodium: 138 mmol/L (ref 134–144)
Total Protein: 7.2 g/dL (ref 6.0–8.5)

## 2019-06-19 LAB — CBC WITH DIFFERENTIAL
Basophils Absolute: 0 10*3/uL (ref 0.0–0.2)
Basos: 1 %
EOS (ABSOLUTE): 0.1 10*3/uL (ref 0.0–0.4)
Eos: 2 %
Hematocrit: 45.5 % (ref 37.5–51.0)
Hemoglobin: 14.6 g/dL (ref 13.0–17.7)
Immature Grans (Abs): 0 10*3/uL (ref 0.0–0.1)
Immature Granulocytes: 0 %
Lymphocytes Absolute: 1.3 10*3/uL (ref 0.7–3.1)
Lymphs: 22 %
MCH: 30.6 pg (ref 26.6–33.0)
MCHC: 32.1 g/dL (ref 31.5–35.7)
MCV: 95 fL (ref 79–97)
Monocytes Absolute: 0.6 10*3/uL (ref 0.1–0.9)
Monocytes: 10 %
Neutrophils Absolute: 3.8 10*3/uL (ref 1.4–7.0)
Neutrophils: 65 %
RBC: 4.77 x10E6/uL (ref 4.14–5.80)
RDW: 12.5 % (ref 11.6–15.4)
WBC: 5.8 10*3/uL (ref 3.4–10.8)

## 2019-06-19 LAB — B12 AND FOLATE PANEL
Folate: 19.4 ng/mL (ref >3.0)
Vitamin B-12: 382 pg/mL (ref 232–1245)

## 2019-06-19 LAB — TSH: TSH: 0.991 u[IU]/mL (ref 0.450–4.500)

## 2019-06-19 LAB — VITAMIN D 25 HYDROXY (VIT D DEFICIENCY, FRACTURES): Vit D, 25-Hydroxy: 25.6 ng/mL — ABNORMAL LOW (ref 30.0–100.0)

## 2019-06-21 ENCOUNTER — Telehealth: Payer: Self-pay

## 2019-06-21 NOTE — Telephone Encounter (Signed)
Patient called, left VM to return the call to the office for lab results.  ?

## 2019-06-21 NOTE — Telephone Encounter (Signed)
LMTCB, PEC may give result.  

## 2019-06-21 NOTE — Telephone Encounter (Signed)
-----   Message from Virginia Crews, MD sent at 06/21/2019 12:18 PM EDT ----- Normal labs, except Vit D level is low.  Recommend starting OTC Vit D3 1000-2000 units daily

## 2019-06-22 ENCOUNTER — Telehealth: Payer: Self-pay | Admitting: *Deleted

## 2019-06-22 DIAGNOSIS — B002 Herpesviral gingivostomatitis and pharyngotonsillitis: Secondary | ICD-10-CM

## 2019-06-22 DIAGNOSIS — Z87891 Personal history of nicotine dependence: Secondary | ICD-10-CM | POA: Diagnosis not present

## 2019-06-22 DIAGNOSIS — Z7982 Long term (current) use of aspirin: Secondary | ICD-10-CM | POA: Diagnosis not present

## 2019-06-22 DIAGNOSIS — Z8249 Family history of ischemic heart disease and other diseases of the circulatory system: Secondary | ICD-10-CM | POA: Diagnosis not present

## 2019-06-22 DIAGNOSIS — R030 Elevated blood-pressure reading, without diagnosis of hypertension: Secondary | ICD-10-CM | POA: Diagnosis not present

## 2019-06-22 NOTE — Telephone Encounter (Signed)
Pt returned call and was given the message from Dr. Brita Romp regarding his lab results. He verbalized understanding to take the OTC vitamin D3.  He wanted to let Dr. Brita Romp know the medicine she gave him for his mouth is not helping.   "I've been taking it as prescribed but it's not helping".   I let him know I would pass this information to her and someone would be in touch with him with her answer.   He was agreeable to this.

## 2019-06-23 MED ORDER — NYSTATIN 100000 UNIT/ML MT SUSP: 5.0000 mL | Freq: Four times a day (QID) | OROMUCOSAL | 0 refills | Status: DC

## 2019-06-23 NOTE — Telephone Encounter (Signed)
Add Nystatin 90mL QID swish and swallow.  If not improving, will recommend ENT referral, so we could go ahead and place this and then he can call if he is doing better

## 2019-06-23 NOTE — Telephone Encounter (Signed)
Sent in new medication, referral placed for ENT. Left detailed message for patient.

## 2019-06-23 NOTE — Addendum Note (Signed)
Addended by: Shawna Orleans on: 06/23/2019 01:46 PM   Modules accepted: Orders

## 2019-07-01 DIAGNOSIS — R69 Illness, unspecified: Secondary | ICD-10-CM | POA: Diagnosis not present

## 2019-07-08 ENCOUNTER — Other Ambulatory Visit: Payer: Self-pay | Admitting: Otolaryngology

## 2019-07-08 DIAGNOSIS — L578 Other skin changes due to chronic exposure to nonionizing radiation: Secondary | ICD-10-CM | POA: Diagnosis not present

## 2019-07-08 DIAGNOSIS — K1321 Leukoplakia of oral mucosa, including tongue: Secondary | ICD-10-CM | POA: Diagnosis not present

## 2019-07-08 DIAGNOSIS — R682 Dry mouth, unspecified: Secondary | ICD-10-CM | POA: Diagnosis not present

## 2019-07-13 LAB — SURGICAL PATHOLOGY

## 2019-07-23 DIAGNOSIS — B379 Candidiasis, unspecified: Secondary | ICD-10-CM | POA: Diagnosis not present

## 2019-07-23 DIAGNOSIS — L578 Other skin changes due to chronic exposure to nonionizing radiation: Secondary | ICD-10-CM | POA: Diagnosis not present

## 2019-08-05 DIAGNOSIS — H04123 Dry eye syndrome of bilateral lacrimal glands: Secondary | ICD-10-CM | POA: Diagnosis not present

## 2019-08-18 ENCOUNTER — Ambulatory Visit: Payer: Self-pay | Admitting: Family Medicine

## 2019-08-25 DIAGNOSIS — R69 Illness, unspecified: Secondary | ICD-10-CM | POA: Diagnosis not present

## 2019-09-16 DIAGNOSIS — H04123 Dry eye syndrome of bilateral lacrimal glands: Secondary | ICD-10-CM | POA: Diagnosis not present

## 2019-09-20 DIAGNOSIS — R69 Illness, unspecified: Secondary | ICD-10-CM | POA: Diagnosis not present

## 2019-09-21 ENCOUNTER — Ambulatory Visit: Payer: Self-pay | Admitting: Family Medicine

## 2019-09-21 NOTE — Progress Notes (Deleted)
Established patient visit   Patient: Gregory Arroyo   DOB: 22-Jan-1941   79 y.o. Male  MRN: [id number] Visit Date: 09/21/2019  Today's healthcare provider: Lavon Paganini, MD  Clint Bolder as a scribe for Lavon Paganini, MD.,have documented all relevant documentation on the behalf of Lavon Paganini, MD,as directed by  Lavon Paganini, MD while in the presence of Lavon Paganini, MD.  No chief complaint on file.  Subjective    HPI  Follow up for Fatigue & Weight Loss  The patient was last seen for this 3 months ago. Changes made at last visit include labs ordered. He feels that condition is {improved/worse/unchanged:[id number]}.    -----------------------------------------------------------------------------------------   Patient Active Problem List   Diagnosis Date Noted  . History of skin cancer 04/19/2019  . Reactive airway disease 05/08/2015  . H/O adenomatous polyp of colon 08/29/2014  . Pre-diabetes 08/29/2014  . Skin lesion of face 08/29/2014  . Vertigo 08/29/2014  . External hemorrhoids without complication 90/24/0973  . Testicular hypofunction 05/06/2008  . Family history of colon cancer 04/28/2008  . Allergic rhinitis 04/27/2008  . Decreased libido 04/27/2008   Past Medical History:  Diagnosis Date  . Basal cell carcinoma 11/04/2018   right antihelix  . History of chicken pox   . History of measles   . Hx of squamous cell carcinoma of skin 02/15/2013   L ear concha  . Squamous cell carcinoma of skin 02/15/2013   L ear concha   Social History   Tobacco Use  . Smoking status: Former Smoker    Packs/day: 1.00    Years: 2.00    Pack years: 2.00    Types: Cigarettes    Quit date: 02/04/1958    Years since quitting: 61.6  . Smokeless tobacco: Never Used  Vaping Use  . Vaping Use: Never used  Substance Use Topics  . Alcohol use: Yes    Alcohol/week: 7.0 standard drinks    Types: 7 Glasses of wine per week  . Drug use:  No   Allergies  Allergen Reactions  . Pollen Extract        Medications: Outpatient Medications Prior to Visit  Medication Sig  . aspirin 81 MG tablet Take 1 tablet by mouth daily.  . Cetirizine HCl (ZYRTEC ALLERGY) 10 MG CAPS Take 1 tablet by mouth daily.   . fluticasone (FLONASE) 50 MCG/ACT nasal spray Place 2 sprays into both nostrils daily. (Patient taking differently: Place 2 sprays into both nostrils daily. As needed)  . loratadine (CLARITIN) 10 MG tablet Take 1 tablet by mouth daily as needed.  . nystatin (MYCOSTATIN) 100000 UNIT/ML suspension Take 5 mLs (500,000 Units total) by mouth 4 (four) times daily.  . Omega-3 Fatty Acids (FISH OIL PO) Take 1 capsule by mouth daily.  . valACYclovir (VALTREX) 1000 MG tablet 2 tablets twice a day for 1 day as needed for cold sores (Patient not taking: Reported on 06/18/2019)   No facility-administered medications prior to visit.    Review of Systems  Last CBC Lab Results  Component Value Date   WBC 5.8 06/18/2019   HGB 14.6 06/18/2019   HCT 45.5 06/18/2019   MCV 95 06/18/2019   MCH 30.6 06/18/2019   RDW 12.5 06/18/2019   PLT 321 53/29/9242   Last metabolic panel Lab Results  Component Value Date   GLUCOSE 77 06/18/2019   NA 138 06/18/2019   K 4.2 06/18/2019   CL 101 06/18/2019   CO2 24 06/18/2019  BUN 14 06/18/2019   CREATININE 0.93 06/18/2019   GFRNONAA 78 06/18/2019   GFRAA 90 06/18/2019   CALCIUM 9.6 06/18/2019   PROT 7.2 06/18/2019   ALBUMIN 3.8 06/18/2019   LABGLOB 3.4 06/18/2019   AGRATIO 1.1 (L) 06/18/2019   BILITOT 0.2 06/18/2019   ALKPHOS 63 06/18/2019   AST 27 06/18/2019   ALT 16 06/18/2019   ANIONGAP 11 01/23/2019   Last thyroid functions Lab Results  Component Value Date   TSH 0.991 06/18/2019   Last vitamin D Lab Results  Component Value Date   VD25OH 25.6 (L) 06/18/2019   Last vitamin B12 and Folate Lab Results  Component Value Date   VITAMINB12 382 06/18/2019   FOLATE 19.4 06/18/2019        Objective    There were no vitals taken for this visit. Wt Readings from Last 3 Encounters:  06/18/19 144 lb (65.3 kg)  05/28/19 149 lb 9.6 oz (67.9 kg)  03/24/19 154 lb 6.4 oz (70 kg)      Physical Exam  ***  No results found for any visits on 09/21/19.  Assessment & Plan     ***  No follow-ups on file.      {provider attestation***:1}   Lavon Paganini, MD  [facility] [phone] (phone) [phone] (fax)  [place]

## 2019-10-19 ENCOUNTER — Other Ambulatory Visit: Payer: Self-pay

## 2019-10-19 ENCOUNTER — Ambulatory Visit (INDEPENDENT_AMBULATORY_CARE_PROVIDER_SITE_OTHER): Payer: Medicare HMO | Admitting: Dermatology

## 2019-10-19 DIAGNOSIS — D1800 Hemangioma unspecified site: Secondary | ICD-10-CM | POA: Diagnosis not present

## 2019-10-19 DIAGNOSIS — L578 Other skin changes due to chronic exposure to nonionizing radiation: Secondary | ICD-10-CM | POA: Diagnosis not present

## 2019-10-19 DIAGNOSIS — D229 Melanocytic nevi, unspecified: Secondary | ICD-10-CM

## 2019-10-19 DIAGNOSIS — L821 Other seborrheic keratosis: Secondary | ICD-10-CM

## 2019-10-19 DIAGNOSIS — K130 Diseases of lips: Secondary | ICD-10-CM

## 2019-10-19 DIAGNOSIS — L814 Other melanin hyperpigmentation: Secondary | ICD-10-CM | POA: Diagnosis not present

## 2019-10-19 DIAGNOSIS — Z85828 Personal history of other malignant neoplasm of skin: Secondary | ICD-10-CM

## 2019-10-19 DIAGNOSIS — Z1283 Encounter for screening for malignant neoplasm of skin: Secondary | ICD-10-CM | POA: Diagnosis not present

## 2019-10-19 MED ORDER — TACROLIMUS 0.1 % EX OINT: TOPICAL_OINTMENT | Freq: Two times a day (BID) | CUTANEOUS | 1 refills | Status: DC

## 2019-10-19 NOTE — Patient Instructions (Signed)
Recommend daily broad spectrum sunscreen SPF 30+ to sun-exposed areas, reapply every 2 hours as needed. Call for new or changing lesions.  

## 2019-10-19 NOTE — Progress Notes (Signed)
   Follow-Up Visit   Subjective  Gregory Arroyo is a 79 y.o. male who presents for the following: TBSE.  Patient presents today for TBSE, has on area of concern in his mouth, bottom lip is sore. Pt reports it is sore/tender all over, not just in one spot.  He has partial denture of front lower teeth, that may rub on inside gum.  Patient has h/o BCC Right Anti helix 11/04/18 and [facility] Left Ear Concha 02/15/2013. No new or changing spots.  Nothing bothersome.  The following portions of the chart were reviewed this encounter and updated as appropriate:      Review of Systems:  No other skin or systemic complaints except as noted in HPI or Assessment and Plan.  Objective  Well appearing patient in no apparent distress; mood and affect are within normal limits.  A full examination was performed including scalp, head, eyes, ears, nose, lips, neck, chest, axillae, abdomen, back, buttocks, bilateral upper extremities, bilateral lower extremities, hands, feet, fingers, toes, fingernails, and toenails. All findings within normal limits unless otherwise noted below.  Objective  Mid [place] Lip: Mild scaling of lower lip and oral commissures. No vesicles. No sores.  Mucosal lip clear.    Assessment & Plan  Cheilitis Mid Lower [place] Lip  Start Tacrolimus 0.1% ointment apply to lower lip twice daily  Aquaphor ointment prn during day.  tacrolimus (PROTOPIC) 0.1 % ointment - Mid Lower [place] Lip   Lentigines - Scattered tan macules - Discussed due to sun exposure - Benign, observe - Call for any changes  Seborrheic Keratoses - Stuck-on, waxy, tan-brown papules and plaques  - Discussed benign etiology and prognosis. - Observe - Call for any changes  Melanocytic Nevi - Tan-brown and/or pink-flesh-colored symmetric macules and papules - Benign appearing on exam today - Observation - Call clinic for new or changing moles - Recommend daily use of broad spectrum spf 30+ sunscreen  to sun-exposed areas.   Hemangiomas - Red papules - Discussed benign nature - Observe - Call for any changes  Actinic Damage - diffuse scaly erythematous macules with underlying dyspigmentation - Recommend daily broad spectrum sunscreen SPF 30+ to sun-exposed areas, reapply every 2 hours as needed.  - Call for new or changing lesions.  Skin cancer screening performed today. History of Basal Cell Carcinoma of the Skin Right Anti Helix 11/04/18 - No evidence of recurrence today - Recommend regular full body skin exams - Recommend daily broad spectrum sunscreen SPF 30+ to sun-exposed areas, reapply every 2 hours as needed.  - Call if any new or changing lesions are noted between office visits  History of Squamous Cell Carcinoma of the Skin Left ear concha 02/15/13 - No evidence of recurrence today - Recommend regular full body skin exams - Recommend daily broad spectrum sunscreen SPF 30+ to sun-exposed areas, reapply every 2 hours as needed.  - Call if any new or changing lesions are noted between office visits  Return for 4 to 6 weeks chelitis.  Marene Lenz, CMA, am acting as scribe for Brendolyn Patty, MD .  Documentation: I have reviewed the above documentation for accuracy and completeness, and I agree with the above.  Brendolyn Patty MD

## 2019-10-21 DIAGNOSIS — R682 Dry mouth, unspecified: Secondary | ICD-10-CM | POA: Diagnosis not present

## 2019-10-21 DIAGNOSIS — D100 Benign neoplasm of lip: Secondary | ICD-10-CM | POA: Diagnosis not present

## 2019-10-21 DIAGNOSIS — K130 Diseases of lips: Secondary | ICD-10-CM | POA: Diagnosis not present

## 2019-10-21 DIAGNOSIS — Z7689 Persons encountering health services in other specified circumstances: Secondary | ICD-10-CM | POA: Diagnosis not present

## 2019-10-25 LAB — SURGICAL PATHOLOGY

## 2019-11-14 DIAGNOSIS — R69 Illness, unspecified: Secondary | ICD-10-CM | POA: Diagnosis not present

## 2019-11-17 DIAGNOSIS — Z972 Presence of dental prosthetic device (complete) (partial): Secondary | ICD-10-CM | POA: Diagnosis not present

## 2019-11-17 DIAGNOSIS — Z87891 Personal history of nicotine dependence: Secondary | ICD-10-CM | POA: Diagnosis not present

## 2019-11-17 DIAGNOSIS — K130 Diseases of lips: Secondary | ICD-10-CM | POA: Diagnosis not present

## 2019-11-17 DIAGNOSIS — Z79899 Other long term (current) drug therapy: Secondary | ICD-10-CM | POA: Diagnosis not present

## 2019-11-17 DIAGNOSIS — K117 Disturbances of salivary secretion: Secondary | ICD-10-CM | POA: Diagnosis not present

## 2019-11-17 DIAGNOSIS — Z7982 Long term (current) use of aspirin: Secondary | ICD-10-CM | POA: Diagnosis not present

## 2019-11-17 DIAGNOSIS — K9429 Other complications of gastrostomy: Secondary | ICD-10-CM | POA: Diagnosis not present

## 2019-11-17 DIAGNOSIS — M3500 Sicca syndrome, unspecified: Secondary | ICD-10-CM | POA: Diagnosis not present

## 2019-11-17 DIAGNOSIS — K9281 Gastrointestinal mucositis (ulcerative): Secondary | ICD-10-CM | POA: Diagnosis not present

## 2019-11-17 NOTE — Progress Notes (Signed)
Subjective:   Gregory Arroyo is a 79 y.o. male who presents for Medicare Annual/Subsequent preventive examination.  I connected with Vitaly Wanat today by telephone and verified that I am speaking with the correct person using two identifiers. Location patient: home Location provider: work Persons participating in the virtual visit: patient, provider.   I discussed the limitations, risks, security and privacy concerns of performing an evaluation and management service by telephone and the availability of in person appointments. I also discussed with the patient that there may be a patient responsible charge related to this service. The patient expressed understanding and verbally consented to this telephonic visit.    Interactive audio and video telecommunications were attempted between this provider and patient, however failed, due to patient having technical difficulties OR patient did not have access to video capability.  We continued and completed visit with audio only.   Review of Systems    N/A  Cardiac Risk Factors include: advanced age (>31men, >51 women);male gender     Objective:    There were no vitals filed for this visit. There is no height or weight on file to calculate BMI.  Advanced Directives 11/22/2019 01/23/2019 11/03/2018 10/31/2017 10/29/2016 05/06/2015  Does Patient Have a Medical Advance Directive? Yes No Yes Yes Yes No  Type of Printmaker of [place];Living will [place];Living will [place];Living will -  Copy of [place] in Chart? Yes - validated most recent copy scanned in chart (See row information) - Yes - validated most recent copy scanned in chart (See row information) No - copy requested No - copy requested -  Would patient like information on creating a medical advance directive? - - - - - Yes - Educational materials given    Current  Medications (verified) Outpatient Encounter Medications as of 11/22/2019  Medication Sig  . aspirin 81 MG tablet Take 1 tablet by mouth daily.  . Cetirizine HCl (ZYRTEC ALLERGY) 10 MG CAPS Take 1 tablet by mouth daily.   . fluticasone (FLONASE) 50 MCG/ACT nasal spray Place 2 sprays into both nostrils daily. (Patient taking differently: Place 2 sprays into both nostrils daily. As needed)  . loratadine (CLARITIN) 10 MG tablet Take 1 tablet by mouth daily as needed.  . Multiple Vitamins-Minerals (SENIOR MULTIVITAMIN PLUS PO) Take by mouth daily.  . Omega-3 Fatty Acids (FISH OIL PO) Take 1 capsule by mouth daily.  Marland Kitchen XIIDRA 5 % SOLN Instill 1 drop into both eyes twice a day  . dexamethasone (DECADRON) 0.5 MG/5ML solution SMARTSIG:3 By Mouth 10 Times Daily PRN (Patient not taking: Reported on 11/22/2019)  . nystatin (MYCOSTATIN) 100000 UNIT/ML suspension Take 5 mLs (500,000 Units total) by mouth 4 (four) times daily. (Patient not taking: Reported on 11/22/2019)  . nystatin ointment (MYCOSTATIN) Apply topically 3 (three) times daily. (Patient not taking: Reported on 11/22/2019)  . tacrolimus (PROTOPIC) 0.1 % ointment Apply topically in the morning and at bedtime. To Lower Lip (Patient not taking: Reported on 11/22/2019)  . valACYclovir (VALTREX) 1000 MG tablet 2 tablets twice a day for 1 day as needed for cold sores (Patient not taking: Reported on 11/22/2019)   No facility-administered encounter medications on file as of 11/22/2019.    Allergies (verified) Pollen extract   History: Past Medical History:  Diagnosis Date  . Basal cell carcinoma 11/04/2018   right antihelix  . History of chicken pox   . History of measles   .  Hx of squamous cell carcinoma of skin 02/15/2013   L ear concha  . Squamous cell carcinoma of skin 02/15/2013   L ear concha   Past Surgical History:  Procedure Laterality Date  . CATARACT EXTRACTION Right 04/2011   Dr. Martie Round, [place]  . HERNIA REPAIR Right 2010   Dr. Bary Castilla; Inguinal Hernia  . [place]  2001  . SHOULDER SURGERY Left 2009   Dr. Sabra Heck   Family History  Problem Relation Age of Onset  . Stroke Mother   . Heart disease Mother   . Transient ischemic attack Mother   . Heart attack Father   . Heart disease Father   . Colon cancer Sister 61   Social History   Socioeconomic History  . Marital status: Married    Spouse name: Not on file  . Number of children: 4  . Years of education: Not on file  . Highest education level: Associate degree: occupational, Hotel manager, or vocational program  Occupational History  . 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 Smoking status: Former Smoker    Packs/day: 1.00    Years: 2.00    Pack years: 2.00    Types: Cigarettes    Quit date: 02/04/1958    Years since quitting: 61.8  . Smokeless tobacco: Never Used  Vaping Use  . Vaping Use: Never used  Substance and Sexual Activity  . Alcohol use: Yes    Alcohol/week: 1.0 standard drink    Types: 1 Glasses of wine per week  . Drug use: No  . Sexual activity: Not on file  Other Topics Concern  . Not on file  Social History Narrative  . Not on file   Social Determinants of Health   Financial Resource Strain: Low Risk   . Difficulty of Paying Living Expenses: Not hard at all  Food Insecurity: No Food Insecurity  . Worried About Charity fundraiser in the Last Year: Never true  . Ran Out of Food in the Last Year: Never true  Transportation Needs: No Transportation Needs  . Lack of Transportation (Medical): No  . Lack of Transportation (Non-Medical): No  Physical Activity: Inactive  . Days of Exercise per Week: 0 days  . Minutes of Exercise per Session: 0 min  Stress: No Stress Concern Present  . Feeling of Stress : Not at all  Social Connections: Socially Integrated  . Frequency of  Communication with Friends and Family: More than three times a week  . Frequency of Social Gatherings with Friends and Family: More than three times a week  . Attends Religious Services: More than 4 times per year  . Active Member of Clubs or Organizations: Yes  . Attends Archivist Meetings: More than 4 times per year  . Marital Status: Married    Tobacco Counseling Counseling given: Not Answered   Clinical Intake:  Pre-visit preparation completed: Yes  Pain : No/denies pain     Nutritional Risks: None Diabetes: No  How often do you need to have someone help you when you read instructions, pamphlets, or other written materials from your doctor or pharmacy?: 1 - Never  Diabetic? No  Interpreter Needed?: No  Information entered by :: Mmarkoski, LPN   Activities of Daily Living In your present state of health, do you have any difficulty performing the following activities:  11/22/2019  Hearing? N  Comment Wears bilateral hearing aids.  Vision? N  Comment Wears eye glasses.  Difficulty concentrating or making decisions? N  Walking or climbing stairs? N  Dressing or bathing? N  Doing errands, shopping? N  Preparing Food and eating ? N  Using the Toilet? N  In the past six months, have you accidently leaked urine? N  Do you have problems with loss of bowel control? N  Managing your Medications? N  Managing your Finances? N  Housekeeping or managing your Housekeeping? N  Some recent data might be hidden    Patient Care Team: Virginia Crews, MD as PCP - General (Family Medicine) Brendolyn Patty, MD (Dermatology) Eulogio Bear, MD as Consulting Physician (Ophthalmology)  Indicate any recent Medical Services you may have received from other than [facility] providers in the past year (date may be approximate).     Assessment:   This is a routine wellness examination for [place].  Hearing/Vision screen No exam data present  Dietary issues and  exercise activities discussed: Current Exercise Habits: The patient does not participate in regular exercise at present, Exercise limited by: None identified  Goals   None    Depression Screen PHQ 2/9 Scores 11/22/2019 11/03/2018 10/31/2017 10/29/2016 09/30/2016 09/30/2014 09/30/2014  PHQ - 2 Score 0 0 0 0 0 0 0  PHQ- 9 Score - - - - - 0 -    Fall Risk Fall Risk  11/22/2019 11/03/2018 10/31/2017 10/29/2016 09/30/2016  Falls in the past year? 0 0 No No No  Number falls in past yr: 0 - - - -  Injury with Fall? 0 - - - -    Any stairs in or around the home? Yes  If so, are there any without handrails? No  Home free of loose throw rugs in walkways, pet beds, electrical cords, etc? Yes  Adequate lighting in your home to reduce risk of falls? Yes   ASSISTIVE DEVICES UTILIZED TO PREVENT FALLS:  Life alert? No  Use of a cane, walker or w/c? No  Grab bars in the bathroom? No  Shower chair or bench in shower? No  Elevated toilet seat or a handicapped toilet? No    Cognitive Function: Declined today.         Immunizations Immunization History  Administered Date(s) Administered  . Influenza, High Dose Seasonal PF 11/06/2013, 01/06/2015, 12/21/2015, 09/30/2016, 10/31/2017, 10/23/2018, 11/17/2019  . Pneumococcal Conjugate-13 01/06/2015  . Pneumococcal Polysaccharide-23 04/27/2008  . Tdap 05/27/2011  . Zoster 05/27/2011  . Zoster Recombinat (Shingrix) 05/19/2017, 10/02/2018    TDAP status: Up to date Flu Vaccine status: Up to date Pneumococcal vaccine status: Up to date Covid-19 vaccine status: Completed vaccines  Qualifies for Shingles Vaccine? Yes   Zostavax completed Yes   Shingrix Completed?: Yes  Screening Tests Health Maintenance  Topic Date Due  . COVID-19 Vaccine (1) Never done  . TETANUS/TDAP  05/26/2021  . INFLUENZA VACCINE  Completed  . PNA vac Low Risk Adult  Completed    Health Maintenance  Health Maintenance Due  Topic Date Due  . COVID-19 Vaccine (1) Never  done    Colorectal cancer screening: No longer required.   Lung Cancer Screening: (Low Dose CT Chest recommended if Age 57-80 years, 30 pack-year currently smoking OR have quit w/in 15years.) does not qualify.    Additional Screening:  Vision Screening: Recommended annual ophthalmology exams for early detection of glaucoma and other disorders of the eye. Is the patient up to  date with their annual eye exam?  Yes  Who is the provider or what is the name of the office in which the patient attends annual eye exams? Dr Edison Pace @ [place] If pt is not established with a provider, would they like to be referred to a provider to establish care? No .   Dental Screening: Recommended annual dental exams for proper oral hygiene  Community Resource Referral / Chronic Care Management: CRR required this visit?  No   CCM required this visit?  No      Plan:     I have personally reviewed and noted the following in the patient's chart:   . Medical and social history . Use of alcohol, tobacco or illicit drugs  . Current medications and supplements . Functional ability and status . Nutritional status . Physical activity . Advanced directives . List of other physicians . Hospitalizations, surgeries, and ER visits in previous 12 months . Vitals . Screenings to include cognitive, depression, and falls . Referrals and appointments  In addition, I have reviewed and discussed with patient certain preventive protocols, quality metrics, and best practice recommendations. A written personalized care plan for preventive services as well as general preventive health recommendations were provided to patient.     Shea Swalley [place], [place]   [id number]   Nurse Notes: Requested Covid vaccine card information at next in office apt to up date chart.

## 2019-11-22 ENCOUNTER — Ambulatory Visit (INDEPENDENT_AMBULATORY_CARE_PROVIDER_SITE_OTHER): Payer: Medicare HMO

## 2019-11-22 ENCOUNTER — Other Ambulatory Visit: Payer: Self-pay

## 2019-11-22 DIAGNOSIS — Z0000 Encounter for general adult medical examination without abnormal findings: Secondary | ICD-10-CM | POA: Diagnosis not present

## 2019-11-22 NOTE — Patient Instructions (Signed)
Gregory Arroyo , Thank you for taking time to come for your Medicare Wellness Visit. I appreciate your ongoing commitment to your health goals. Please review the following plan we discussed and let me know if I can assist you in the future.   Screening recommendations/referrals: Colonoscopy: No longer required.  Recommended yearly ophthalmology/optometry visit for glaucoma screening and checkup Recommended yearly dental visit for hygiene and checkup  Vaccinations: Influenza vaccine: Done 11/17/19 Pneumococcal vaccine: Completed series Tdap vaccine: Up to date, due 05/2021 Shingles vaccine: Completed series    Advanced directives: POA on file.  Conditions/risks identified: None.  Next appointment: 02/21/19 @ 10:00 AM with Dr Brita Romp   Preventive Care 55 Years and Older, Male Preventive care refers to lifestyle choices and visits with your health care provider that can promote health and wellness. What does preventive care include?  A yearly physical exam. This is also called an annual well check.  Dental exams once or twice a year.  Routine eye exams. Ask your health care provider how often you should have your eyes checked.  Personal lifestyle choices, including:  Daily care of your teeth and gums.  Regular physical activity.  Eating a healthy diet.  Avoiding tobacco and drug use.  Limiting alcohol use.  Practicing safe sex.  Taking low doses of aspirin every day.  Taking vitamin and mineral supplements as recommended by your health care provider. What happens during an annual well check? The services and screenings done by your health care provider during your annual well check will depend on your age, overall health, lifestyle risk factors, and family history of disease. Counseling  Your health care provider may ask you questions about your:  Alcohol use.  Tobacco use.  Drug use.  Emotional well-being.  Home and relationship well-being.  Sexual  activity.  Eating habits.  History of falls.  Memory and ability to understand (cognition).  Work and work Statistician. Screening  You may have the following tests or measurements:  Height, weight, and BMI.  Blood pressure.  Lipid and cholesterol levels. These may be checked every 5 years, or more frequently if you are over 44 years old.  Skin check.  Lung cancer screening. You may have this screening every year starting at age 72 if you have a 30-pack-year history of smoking and currently smoke or have quit within the past 15 years.  Fecal occult blood test (FOBT) of the stool. You may have this test every year starting at age 41.  Flexible sigmoidoscopy or colonoscopy. You may have a sigmoidoscopy every 5 years or a colonoscopy every 10 years starting at age 57.  Prostate cancer screening. Recommendations will vary depending on your family history and other risks.  Hepatitis C blood test.  Hepatitis B blood test.  Sexually transmitted disease (STD) testing.  Diabetes screening. This is done by checking your blood sugar (glucose) after you have not eaten for a while (fasting). You may have this done every 1-3 years.  Abdominal aortic aneurysm (AAA) screening. You may need this if you are a current or former smoker.  Osteoporosis. You may be screened starting at age 21 if you are at high risk. Talk with your health care provider about your test results, treatment options, and if necessary, the need for more tests. Vaccines  Your health care provider may recommend certain vaccines, such as:  Influenza vaccine. This is recommended every year.  Tetanus, diphtheria, and acellular pertussis (Tdap, Td) vaccine. You may need a Td booster every 10  years.  Zoster vaccine. You may need this after age 73.  Pneumococcal 13-valent conjugate (PCV13) vaccine. One dose is recommended after age 48.  Pneumococcal polysaccharide (PPSV23) vaccine. One dose is recommended after age  78. Talk to your health care provider about which screenings and vaccines you need and how often you need them. This information is not intended to replace advice given to you by your health care provider. Make sure you discuss any questions you have with your health care provider. Document Released: 02/17/2015 Document Revised: 10/11/2015 Document Reviewed: 11/22/2014 Elsevier Interactive Patient Education  2017 [place] Prevention in the Home Falls can cause injuries. They can happen to people of all ages. There are many things you can do to make your home safe and to help prevent falls. What can I do on the outside of my home?  Regularly fix the edges of walkways and driveways and fix any cracks.  Remove anything that might make you trip as you walk through a door, such as a raised step or threshold.  Trim any bushes or trees on the path to your home.  Use bright outdoor lighting.  Clear any walking paths of anything that might make someone trip, such as rocks or tools.  Regularly check to see if handrails are loose or broken. Make sure that both sides of any steps have handrails.  Any raised decks and porches should have guardrails on the edges.  Have any leaves, snow, or ice cleared regularly.  Use sand or salt on walking paths during winter.  Clean up any spills in your garage right away. This includes oil or grease spills. What can I do in the bathroom?  Use night lights.  Install grab bars by the toilet and in the tub and shower. Do not use towel bars as grab bars.  Use non-skid mats or decals in the tub or shower.  If you need to sit down in the shower, use a plastic, non-slip stool.  Keep the floor dry. Clean up any water that spills on the floor as soon as it happens.  Remove soap buildup in the tub or shower regularly.  Attach bath mats securely with double-sided non-slip rug tape.  Do not have throw rugs and other things on the floor that can make  you trip. What can I do in the bedroom?  Use night lights.  Make sure that you have a light by your bed that is easy to reach.  Do not use any sheets or blankets that are too big for your bed. They should not hang down onto the floor.  Have a firm chair that has side arms. You can use this for support while you get dressed.  Do not have throw rugs and other things on the floor that can make you trip. What can I do in the kitchen?  Clean up any spills right away.  Avoid walking on wet floors.  Keep items that you use a lot in easy-to-reach places.  If you need to reach something above you, use a strong step stool that has a grab bar.  Keep electrical cords out of the way.  Do not use floor polish or wax that makes floors slippery. If you must use wax, use non-skid floor wax.  Do not have throw rugs and other things on the floor that can make you trip. What can I do with my stairs?  Do not leave any items on the stairs.  Make sure that  there are handrails on both sides of the stairs and use them. Fix handrails that are broken or loose. Make sure that handrails are as long as the stairways.  Check any carpeting to make sure that it is firmly attached to the stairs. Fix any carpet that is loose or worn.  Avoid having throw rugs at the top or bottom of the stairs. If you do have throw rugs, attach them to the floor with carpet tape.  Make sure that you have a light switch at the top of the stairs and the bottom of the stairs. If you do not have them, ask someone to add them for you. What else can I do to help prevent falls?  Wear shoes that:  Do not have high heels.  Have rubber bottoms.  Are comfortable and fit you well.  Are closed at the toe. Do not wear sandals.  If you use a stepladder:  Make sure that it is fully opened. Do not climb a closed stepladder.  Make sure that both sides of the stepladder are locked into place.  Ask someone to hold it for you, if  possible.  Clearly mark and make sure that you can see:  Any grab bars or handrails.  First and last steps.  Where the edge of each step is.  Use tools that help you move around (mobility aids) if they are needed. These include:  Canes.  Walkers.  Scooters.  Crutches.  Turn on the lights when you go into a dark area. Replace any light bulbs as soon as they burn out.  Set up your furniture so you have a clear path. Avoid moving your furniture around.  If any of your floors are uneven, fix them.  If there are any pets around you, be aware of where they are.  Review your medicines with your doctor. Some medicines can make you feel dizzy. This can increase your chance of falling. Ask your doctor what other things that you can do to help prevent falls. This information is not intended to replace advice given to you by your health care provider. Make sure you discuss any questions you have with your health care provider. Document Released: 11/17/2008 Document Revised: 06/29/2015 Document Reviewed: 02/25/2014 Elsevier Interactive Patient Education  2017 [employer].

## 2019-11-24 ENCOUNTER — Encounter: Payer: Self-pay | Admitting: Dermatology

## 2019-11-24 ENCOUNTER — Ambulatory Visit: Payer: Medicare HMO | Admitting: Dermatology

## 2019-11-24 ENCOUNTER — Telehealth: Payer: Self-pay

## 2019-11-24 ENCOUNTER — Other Ambulatory Visit: Payer: Self-pay

## 2019-11-24 DIAGNOSIS — B379 Candidiasis, unspecified: Secondary | ICD-10-CM

## 2019-11-24 DIAGNOSIS — K130 Diseases of lips: Secondary | ICD-10-CM | POA: Diagnosis not present

## 2019-11-24 MED ORDER — HYDROCORTISONE 2.5 % EX OINT: TOPICAL_OINTMENT | Freq: Two times a day (BID) | CUTANEOUS | 1 refills | Status: DC

## 2019-11-24 NOTE — Progress Notes (Signed)
   Follow-Up Visit   Subjective  Gregory Arroyo is a 79 y.o. male who presents for the following: Follow-up (Recheck lips. Cheilitis. Use Tacrolimus but did not seem to help so he D/C. He has lab results with him today from Dr. Genevive Bi at [facility], he would like you to review. ) and Skin Problem. Patient states he has had a biopsy inside his mouth and on his lip since last visit. Both showed no signs of cancer, per patient. Labs showed Pos. ANA and Elevated Sed rate.    The following portions of the chart were reviewed this encounter and updated as appropriate:     Review of Systems: No other skin or systemic complaints except as noted in HPI or Assessment and Plan.  Objective  Well appearing patient in no apparent distress; mood and affect are within normal limits.  A focused examination was performed including head, including the scalp, face, neck, nose, ears, eyelids, and lips. Relevant physical exam findings are noted in the Assessment and Plan.  Objective  Lips: Mild erythema and scale at mid lower lip Pt wears dentures    Objective  buccal mucosa: Reticulated white patches at BL buccal mucosa.     Assessment & Plan  Cheilitis Lips  HC 2.5% ointment TID to lips. D/C tacrolimus ointment  Recommend patch testing.  Possible contact irritant from dentures/cleaners/toothpaste?  Pt will return Monday for placement of patch test   hydrocortisone 2.5 % ointment - Lips  Candidiasis buccal mucosa  Vrs Lichen Planus Resume Dr. Lilyan Gilford Magic Mouthwash as directed until improved. Swish and spit bid, do not swallow.  Pt has at home Previous biopsy showed Candida elements Will call [facility] to have dermatopathologist review specimen- look for for Lichen Planus  Return in 5 days (on 11/29/2019) for patch testing.   I, Emelia Salisbury, CMA, am acting as scribe for Brendolyn Patty, MD.  Documentation: I have reviewed the above documentation for accuracy and completeness, and I agree with the  above.  Brendolyn Patty MD

## 2019-11-24 NOTE — Patient Instructions (Addendum)
Topical steroids (such as triamcinolone, fluocinolone, fluocinonide, mometasone, clobetasol, halobetasol, betamethasone, hydrocortisone) can cause thinning and lightening of the skin if they are used for too long in the same area. Your physician has selected the right strength medicine for your problem and area affected on the body. Please use your medication only as directed by your physician to prevent side effects.   . 

## 2019-11-24 NOTE — Telephone Encounter (Signed)
Dr. Nicole Kindred had requested someone from [facility] send slides to be reviewed by dermatopathologist collected 07/08/19. After calling [facility] they determined the specimen was collected and read at [facility]. Called [facility] Pathology lab and was advised to fax over request. Office notes along with original BX sent over to [phone] requesting slides be sent to [facility].

## 2019-11-25 DIAGNOSIS — B379 Candidiasis, unspecified: Secondary | ICD-10-CM | POA: Diagnosis not present

## 2019-11-29 ENCOUNTER — Other Ambulatory Visit: Payer: Self-pay

## 2019-11-29 ENCOUNTER — Ambulatory Visit: Payer: Medicare HMO

## 2019-11-29 DIAGNOSIS — K130 Diseases of lips: Secondary | ICD-10-CM | POA: Diagnosis not present

## 2019-11-30 NOTE — Progress Notes (Signed)
Patch test placed on 11/29/19 on patient's back. Pt advised to keep back as dry as possible until patches removed.

## 2019-12-01 ENCOUNTER — Ambulatory Visit (INDEPENDENT_AMBULATORY_CARE_PROVIDER_SITE_OTHER): Payer: Medicare HMO

## 2019-12-01 ENCOUNTER — Other Ambulatory Visit: Payer: Self-pay

## 2019-12-01 DIAGNOSIS — K130 Diseases of lips: Secondary | ICD-10-CM | POA: Diagnosis not present

## 2019-12-01 NOTE — Progress Notes (Signed)
Patient here for day 3 of True Test. Patch test removed and patient explained how to "read" patch testing through the weekend until he returns for 7 day follow up.   Patient had no signs of any reactions to patch testing today.

## 2019-12-06 ENCOUNTER — Telehealth: Payer: Self-pay

## 2019-12-06 ENCOUNTER — Other Ambulatory Visit: Payer: Self-pay

## 2019-12-06 ENCOUNTER — Ambulatory Visit: Payer: Medicare HMO | Admitting: Dermatology

## 2019-12-06 DIAGNOSIS — D485 Neoplasm of uncertain behavior of skin: Secondary | ICD-10-CM | POA: Diagnosis not present

## 2019-12-06 DIAGNOSIS — B379 Candidiasis, unspecified: Secondary | ICD-10-CM | POA: Diagnosis not present

## 2019-12-06 DIAGNOSIS — K130 Diseases of lips: Secondary | ICD-10-CM | POA: Diagnosis not present

## 2019-12-06 DIAGNOSIS — D492 Neoplasm of unspecified behavior of bone, soft tissue, and skin: Secondary | ICD-10-CM

## 2019-12-06 DIAGNOSIS — L988 Other specified disorders of the skin and subcutaneous tissue: Secondary | ICD-10-CM | POA: Diagnosis not present

## 2019-12-06 MED ORDER — CLOTRIMAZOLE 10 MG MT TROC: 10.0000 mg | Freq: Three times a day (TID) | OROMUCOSAL | 3 refills | Status: DC

## 2019-12-06 MED ORDER — FLUCONAZOLE 200 MG PO TABS: 200.0000 mg | ORAL_TABLET | Freq: Every day | ORAL | 0 refills | Status: AC

## 2019-12-06 NOTE — Patient Instructions (Signed)
Stop the Dukes Magic mouthwash Start the Fluconazole 200mg  one pill a day for 7 days After finish the Fluconazole you can start the Clotrimazole troche 3 times a day as needed Cont the Hydrocortisone 2.5% ointment up to 3 times a day a needed.  Wound Care Instructions  1. Cleanse wound gently with soap and water once a day then pat dry with clean gauze. Apply a thing coat of Petrolatum (petroleum jelly, "Vaseline") over the wound (unless you have an allergy to this). We recommend that you use a new, sterile tube of Vaseline. Do not pick or remove scabs. Do not remove the yellow or white "healing tissue" from the base of the wound.  2. Cover the wound with fresh, clean, nonstick gauze and secure with paper tape. You may use Band-Aids in place of gauze and tape if the would is small enough, but would recommend trimming much of the tape off as there is often too much. Sometimes Band-Aids can irritate the skin.  3. You should call the office for your biopsy report after 1 week if you have not already been contacted.  4. If you experience any problems, such as abnormal amounts of bleeding, swelling, significant bruising, significant pain, or evidence of infection, please call the office immediately.  5. FOR ADULT SURGERY PATIENTS: If you need something for pain relief you may take 1 extra strength Tylenol (acetaminophen) AND 2 Ibuprofen (200mg  each) together every 4 hours as needed for pain. (do not take these if you are allergic to them or if you have a reason you should not take them.) Typically, you may only need pain medication for 1 to 3 days.

## 2019-12-06 NOTE — Progress Notes (Signed)
   Follow-Up Visit   Subjective  Gregory Arroyo is a 79 y.o. male who presents for the following: Cheilitis (lips, HC 2.5% oint tid, Dukes Magic mouthwash tid, improved, day 7 true patch test f/u, pt did not note any reactions ).   The following portions of the chart were reviewed this encounter and updated as appropriate:      Review of Systems:  No other skin or systemic complaints except as noted in HPI or Assessment and Plan.  Objective  Well appearing patient in no apparent distress; mood and affect are within normal limits.  A focused examination was performed including back, face. Relevant physical exam findings are noted in the Assessment and Plan.  Objective  Lips: Mild scaling lower lip  Back clear, no reaction to True test 36  Objective  buccal mucosa: Reticulated white patches at bil buccal mucosa L>R    Objective  Left oral commissure: 6.82mm flat flesh white pap- inferior L oral commissure      Assessment & Plan  Cheilitis Lips  Improving, less symptoms per patient  No reaction to True Patch Test 36- contact dermatitis less likely  Cont HC 2.5% oint qd up to tid  Pt scheduled with Dr. Audry Pili, with [facility], 01/04/20, at this time recommend pt keep appointment     Other Related Medications hydrocortisone 2.5 % ointment  Candidiasis buccal mucosa  Vs Lichen Planus  Previous bx from [facility] showed yeast elements.  Slides have been sent to [facility] for dermatopathologist to review, r/o lichen planus  Start Fluconazole 200mg  1 po qd x 1wk  After finish Fluconazole start Clotrimazole troches tid prn D/c Dukes magic mouthwash for now, use prn flares  fluconazole (DIFLUCAN) 200 MG tablet - buccal mucosa  clotrimazole (MYCELEX) 10 MG troche - buccal mucosa  Neoplasm of skin Left oral commissure  Skin / nail biopsy Type of biopsy: tangential   Informed consent: discussed and consent obtained   Anesthesia: the lesion was  anesthetized in a standard fashion   Anesthesia comment:  Area prepped with alcohol Anesthetic:  1% lidocaine w/ epinephrine 1-100,000 buffered w/ 8.4% NaHCO3 Instrument used: flexible razor blade   Hemostasis achieved with: pressure, aluminum chloride and electrodesiccation   Outcome: patient tolerated procedure well   Post-procedure details: wound care instructions given   Post-procedure details comment:  Ointment and small bandage applied  Specimen 1 - Surgical pathology Differential Diagnosis: D48.5 SK vs Wart vs other r/o SCC Check Margins: No 6.87mm flat flesh white pap  SK vs Wart vs other r/o SCC tangential bx today  Return in about 3 weeks (around 12/27/2019).  I, Othelia Pulling, RMA, am acting as scribe for Brendolyn Patty, MD . Documentation: I have reviewed the above documentation for accuracy and completeness, and I agree with the above.  Brendolyn Patty MD

## 2019-12-06 NOTE — Telephone Encounter (Signed)
Spoke to Sil[place] at [facility] and she advised that she would have Dr. Tamala Julian re-look at slides to r/o Lichen Planus/sh

## 2019-12-13 ENCOUNTER — Telehealth: Payer: Self-pay

## 2019-12-13 NOTE — Telephone Encounter (Signed)
Left message advising him biopsy was benign. Patient can call office with any questions.

## 2019-12-13 NOTE — Telephone Encounter (Signed)
-----   Message from Brendolyn Patty, MD sent at 12/13/2019  1:36 PM EST ----- Skin , left oral commissure BENIGN SQUAMOUS HYPERPLASIA  Benign, no atypical cells, likely a result of irritation to area

## 2019-12-27 ENCOUNTER — Ambulatory Visit: Payer: Medicare HMO | Admitting: Dermatology

## 2019-12-27 ENCOUNTER — Other Ambulatory Visit: Payer: Self-pay

## 2019-12-27 DIAGNOSIS — B379 Candidiasis, unspecified: Secondary | ICD-10-CM

## 2019-12-27 DIAGNOSIS — K130 Diseases of lips: Secondary | ICD-10-CM

## 2019-12-27 NOTE — Progress Notes (Signed)
   Follow-Up Visit   Subjective  Gregory Arroyo is a 80 y.o. male who presents for the following: Follow-up.  The burning tender sensation on his lips is much improved after treatment with diflucan, clotrimazole troches, and HC 2.5% ointment.    The following portions of the chart were reviewed this encounter and updated as appropriate:      Review of Systems:  No other skin or systemic complaints except as noted in HPI or Assessment and Plan.  Objective  Well appearing patient in no apparent distress; mood and affect are within normal limits.  A focused examination was performed including face, mouth. Relevant physical exam findings are noted in the Assessment and Plan.  Objective  Buccal mucosa: Indistinct white reticulation on buccal mucosa bilateral- no cheesy, adherent patches  Previously reviewed (by [facility]) biopsy slides returned to patient, but he just wanted them thrown out in trash, which was done.  Objective  Lips: Clear today.  Bx site well healed L lower lip   Assessment & Plan  Candidiasis Buccal mucosa  Bx-proven Much improved  Continue Clotrimazole troches tid. Pt has refills. Cont Duke's Magic Mouthwash prn flares. Pt will call for refills. May try Listerine instead of Duke's Mouthwash.  Other Related Medications clotrimazole (MYCELEX) 10 MG troche  Cheilitis Lips  Biopsy proven Benign Squamous Hyperplasia.  Improved. Decrease HC 2.5% oint qod and then d/c  and use prn flares VaniCream Ointment, CeraVe Healing Ointment to lips during day- samples given.    hydrocortisone 2.5 % ointment - Lips  Return in about 10 months (around 10/26/2020) for UBSE.   IJamesetta Orleans, CMA, am acting as scribe for Brendolyn Patty, MD .  Documentation: I have reviewed the above documentation for accuracy and completeness, and I agree with the above.  Brendolyn Patty MD

## 2019-12-28 DIAGNOSIS — R69 Illness, unspecified: Secondary | ICD-10-CM | POA: Diagnosis not present

## 2020-01-03 DIAGNOSIS — R69 Illness, unspecified: Secondary | ICD-10-CM | POA: Diagnosis not present

## 2020-01-11 DIAGNOSIS — R69 Illness, unspecified: Secondary | ICD-10-CM | POA: Diagnosis not present

## 2020-01-20 DIAGNOSIS — R69 Illness, unspecified: Secondary | ICD-10-CM | POA: Diagnosis not present

## 2020-02-21 ENCOUNTER — Encounter: Payer: Medicare Other | Admitting: Family Medicine

## 2020-03-15 DIAGNOSIS — H04123 Dry eye syndrome of bilateral lacrimal glands: Secondary | ICD-10-CM | POA: Diagnosis not present

## 2020-03-21 ENCOUNTER — Ambulatory Visit (INDEPENDENT_AMBULATORY_CARE_PROVIDER_SITE_OTHER): Payer: Medicare Other | Admitting: Family Medicine

## 2020-03-21 ENCOUNTER — Other Ambulatory Visit: Payer: Self-pay

## 2020-03-21 VITALS — BP 140/75 | HR 73 | Temp 98.1°F | Wt 153.0 lb

## 2020-03-21 DIAGNOSIS — E559 Vitamin D deficiency, unspecified: Secondary | ICD-10-CM

## 2020-03-21 DIAGNOSIS — Z0000 Encounter for general adult medical examination without abnormal findings: Secondary | ICD-10-CM | POA: Diagnosis not present

## 2020-03-21 DIAGNOSIS — E782 Mixed hyperlipidemia: Secondary | ICD-10-CM

## 2020-03-21 DIAGNOSIS — R7303 Prediabetes: Secondary | ICD-10-CM

## 2020-03-21 DIAGNOSIS — H6123 Impacted cerumen, bilateral: Secondary | ICD-10-CM

## 2020-03-21 NOTE — Progress Notes (Signed)
Complete physical exam   Patient: Gregory Arroyo   DOB: [birth year]   80 y.o. Male  MRN: [id number] Visit Date: 03/21/2020  Today's healthcare provider: Lavon Paganini, MD   No chief complaint on file.  Subjective    Gregory Arroyo is a 80 y.o. male who presents today for a complete physical exam.  He reports consuming a general diet. Patient states he is exercising. He generally feels well. He reports sleeping well. He does not have additional problems to discuss today.  HPI    Past Medical History:  Diagnosis Date  . Basal cell carcinoma 11/04/2018   right antihelix  . History of chicken pox   . History of measles   . Hx of squamous cell carcinoma of skin 02/15/2013   L ear concha  . Squamous cell carcinoma of skin 02/15/2013   L ear concha   Past Surgical History:  Procedure Laterality Date  . CATARACT EXTRACTION Right 04/2011   Dr. Martie Round, [place]  . HERNIA REPAIR Right 2010   Dr. Bary Castilla; Inguinal Hernia  . [place]  2001  . SHOULDER SURGERY Left 2009   Dr. Sabra Heck   Social History   Socioeconomic History  . Marital status: Married    Spouse name: Not on file  . Number of children: 4  . Years of education: Not on file  . Highest education level: Associate degree: occupational, Hotel manager, or vocational program  Occupational History  . 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 Smoking status: Former Smoker    Packs/day: 1.00    Years: 2.00    Pack years: 2.00    Types: Cigarettes    Quit date: 02/04/1958    Years since quitting: 62.1  . Smokeless tobacco: Never Used  Vaping Use  . Vaping Use: Never used  Substance and Sexual Activity  . Alcohol use: Yes    Alcohol/week: 1.0 standard drink    Types: 1 Glasses of wine per week  . Drug use: No  . Sexual activity: Not on file  Other Topics Concern   . Not on file  Social History Narrative  . Not on file   Social Determinants of Health   Financial Resource Strain: Low Risk   . Difficulty of Paying Living Expenses: Not hard at all  Food Insecurity: No Food Insecurity  . Worried About Charity fundraiser in the Last Year: Never true  . Ran Out of Food in the Last Year: Never true  Transportation Needs: No Transportation Needs  . Lack of Transportation (Medical): No  . Lack of Transportation (Non-Medical): No  Physical Activity: Inactive  . Days of Exercise per Week: 0 days  . Minutes of Exercise per Session: 0 min  Stress: No Stress Concern Present  . Feeling of Stress : Not at all  Social Connections: Socially Integrated  . Frequency of Communication with Friends and Family: More than three times a week  . Frequency of Social Gatherings with Friends and Family: More than three times a week  . Attends Religious Services: More than 4 times per year  . Active Member of Clubs or Organizations: Yes  . Attends Archivist Meetings: More than 4 times per year  . Marital Status: Married  Human resources officer Violence: Not At Risk  . Fear of Current or Ex-Partner: No  .  Emotionally Abused: No  . Physically Abused: No  . Sexually Abused: No   Family Status  Relation Name Status  . Mother  Deceased at age 66       Cause of death: Stroke  . Father  Deceased at age 26       Cause of death: Heart attack  . Sister  Alive  . Sister  Alive  . Sister  Alive   Family History  Problem Relation Age of Onset  . Stroke Mother   . Heart disease Mother   . Transient ischemic attack Mother   . Heart attack Father   . Heart disease Father   . Colon cancer Sister 30   Allergies  Allergen Reactions  . Pollen Extract     Patient Care Team: Virginia Crews, MD as PCP - General (Family Medicine) Brendolyn Patty, MD (Dermatology) Eulogio Bear, MD as Consulting Physician (Ophthalmology)   Medications: Outpatient  Medications Prior to Visit  Medication Sig  . aspirin 81 MG tablet Take 1 tablet by mouth daily.  . Cetirizine HCl 10 MG CAPS Take 1 tablet by mouth daily.   . fluticasone (FLONASE) 50 MCG/ACT nasal spray Place 2 sprays into both nostrils daily. (Patient taking differently: Place 2 sprays into both nostrils daily. As needed)  . loratadine (CLARITIN) 10 MG tablet Take 1 tablet by mouth daily as needed.  . Multiple Vitamins-Minerals (SENIOR MULTIVITAMIN PLUS PO) Take by mouth daily.  . Omega-3 Fatty Acids (FISH OIL PO) Take 1 capsule by mouth daily.  Marland Kitchen XIIDRA 5 % SOLN Instill 1 drop into both eyes twice a day  . [DISCONTINUED] clotrimazole (MYCELEX) 10 MG troche Take 1 tablet (10 mg total) by mouth 3 (three) times daily.  . [DISCONTINUED] dexamethasone (DECADRON) 0.5 MG/5ML solution SMARTSIG:3 By Mouth 10 Times Daily PRN (Patient not taking: Reported on 11/22/2019)  . [DISCONTINUED] hydrocortisone 2.5 % ointment Apply topically 2 (two) times daily. To lips 1-2 weeks PRN  . [DISCONTINUED] nystatin (MYCOSTATIN) 100000 UNIT/ML suspension Take 5 mLs (500,000 Units total) by mouth 4 (four) times daily. (Patient not taking: Reported on 11/22/2019)  . [DISCONTINUED] nystatin ointment (MYCOSTATIN) Apply topically 3 (three) times daily. (Patient not taking: Reported on 11/22/2019)  . [DISCONTINUED] valACYclovir (VALTREX) 1000 MG tablet 2 tablets twice a day for 1 day as needed for cold sores (Patient not taking: Reported on 11/22/2019)   No facility-administered medications prior to visit.    Review of Systems  Constitutional: Negative.   HENT: Positive for hearing loss.   Eyes: Negative.   Respiratory: Negative.   Cardiovascular: Negative.   Gastrointestinal: Negative.   Endocrine: Negative.   Genitourinary: Negative.   Musculoskeletal: Positive for arthralgias.  Skin: Negative.   Allergic/Immunologic: Negative.   Neurological: Negative.   Hematological: Negative.   Psychiatric/Behavioral:  Negative.       Objective    BP 140/75 (BP Location: Right Arm, Patient Position: Sitting, Cuff Size: Normal)   Pulse 73   Temp 98.1 F (36.7 C) (Oral)   Wt 153 lb (69.4 kg)   SpO2 98%   BMI 24.69 kg/m    Physical Exam Vitals reviewed.  Constitutional:      General: He is not in acute distress.    Appearance: Normal appearance. He is well-developed. He is not diaphoretic.  HENT:     Head: Normocephalic and atraumatic.     Right Ear: Ear canal and external ear normal. There is impacted cerumen.     Left Ear: Ear  canal and external ear normal. There is impacted cerumen.  Eyes:     General: No scleral icterus.    Conjunctiva/sclera: Conjunctivae normal.     Pupils: Pupils are equal, round, and reactive to light.  Neck:     Thyroid: No thyromegaly.  Cardiovascular:     Rate and Rhythm: Normal rate and regular rhythm.     Pulses: Normal pulses.     Heart sounds: Normal heart sounds. No murmur heard.   Pulmonary:     Effort: Pulmonary effort is normal. No respiratory distress.     Breath sounds: Normal breath sounds. No wheezing or rales.  Abdominal:     General: There is no distension.     Palpations: Abdomen is soft.     Tenderness: There is no abdominal tenderness.  Musculoskeletal:        General: No deformity.     Cervical back: Neck supple.     Right lower leg: No edema.     Left lower leg: No edema.  Lymphadenopathy:     Cervical: No cervical adenopathy.  Skin:    General: Skin is warm and dry.     Findings: No rash.  Neurological:     Mental Status: He is alert and oriented to person, place, and time. Mental status is at baseline.     Gait: Gait normal.  Psychiatric:        Mood and Affect: Mood normal.        Behavior: Behavior normal.      Last depression screening scores PHQ 2/9 Scores 03/21/2020 11/22/2019 11/03/2018  PHQ - 2 Score 0 0 0  PHQ- 9 Score - - -   Last fall risk screening Fall Risk  03/21/2020  Falls in the past year? 0  Number  falls in past yr: 0  Injury with Fall? 0   Last Audit-C alcohol use screening Alcohol Use Disorder Test (AUDIT) 03/21/2020  1. How often do you have a drink containing alcohol? 1  2. How many drinks containing alcohol do you have on a typical day when you are drinking? 0  3. How often do you have six or more drinks on one occasion? 0  AUDIT-C Score 1  Alcohol Brief Interventions/Follow-up AUDIT Score <7 follow-up not indicated   A score of 3 or more in women, and 4 or more in men indicates increased risk for alcohol abuse, EXCEPT if all of the points are from question 1   No results found for any visits on 03/21/20.  Assessment & Plan    Routine Health Maintenance and Physical Exam  Exercise Activities and Dietary recommendations Goals   None     Immunization History  Administered Date(s) Administered  . Influenza, High Dose Seasonal PF 11/06/2013, 01/06/2015, 12/21/2015, 09/30/2016, 10/31/2017, 10/23/2018, 11/17/2019  . Pneumococcal Conjugate-13 01/06/2015  . Pneumococcal Polysaccharide-23 04/27/2008  . Tdap 05/27/2011  . Zoster 05/27/2011  . Zoster Recombinat (Shingrix) 05/19/2017, 10/02/2018    Health Maintenance  Topic Date Due  . COVID-19 Vaccine (1) Never done  . TETANUS/TDAP  05/26/2021  . INFLUENZA VACCINE  Completed  . PNA vac Low Risk Adult  Completed    Discussed health benefits of physical activity, and encouraged him to engage in regular exercise appropriate for his age and condition.  Problem List Items Addressed This Visit      Nervous and Auditory   Bilateral impacted cerumen    Unable to flush in office Will try home debrox x1wk and then bring back  in to flush again        Other   Pre-diabetes    Recommend low carb diet Recheck A1c      Relevant Orders   Hemoglobin A1c   Mixed hyperlipidemia    Reviewed last lipid panel Not currently on a statin Recheck FLP and CMP Discussed diet and exercise       Relevant Orders   Comprehensive  metabolic panel   Lipid panel   Avitaminosis D    Repeat level      Relevant Orders   VITAMIN D 25 Hydroxy (Vit-D Deficiency, Fractures)    Other Visit Diagnoses    Encounter for annual physical exam    -  Primary   Relevant Orders   VITAMIN D 25 Hydroxy (Vit-D Deficiency, Fractures)   Comprehensive metabolic panel   Lipid panel   Hemoglobin A1c       Return in about 1 year (around 03/21/2021) for CPE.     I, Lavon Paganini, MD, have reviewed all documentation for this visit. The documentation on 03/21/20 for the exam, diagnosis, procedures, and orders are all accurate and complete.   Albertine Lafoy, Dionne Bucy, MD, MPH [facility]

## 2020-03-21 NOTE — Assessment & Plan Note (Signed)
Repeat level

## 2020-03-21 NOTE — Assessment & Plan Note (Signed)
Recommend low carb diet °Recheck A1c  °

## 2020-03-21 NOTE — Assessment & Plan Note (Signed)
Unable to flush in office Will try home debrox x1wk and then bring back in to flush again

## 2020-03-21 NOTE — Assessment & Plan Note (Signed)
Reviewed last lipid panel Not currently on a statin Recheck FLP and CMP Discussed diet and exercise  

## 2020-03-21 NOTE — Patient Instructions (Addendum)
Congratulations on your Covid-19 vaccination!  Please bring in your Covid-19 vaccination card to your next appointment or upload a picture to your MyChart for [place] to keep track of your vaccinations.  Thank you!    Preventive Care 102 Years and Older, Male Preventive care refers to lifestyle choices and visits with your health care provider that can promote health and wellness. This includes:  A yearly physical exam. This is also called an annual wellness visit.  Regular dental and eye exams.  Immunizations.  Screening for certain conditions.  Healthy lifestyle choices, such as: ? Eating a healthy diet. ? Getting regular exercise. ? Not using drugs or products that contain nicotine and tobacco. ? Limiting alcohol use. What can I expect for my preventive care visit? Physical exam Your health care provider will check your:  Height and weight. These may be used to calculate your BMI (body mass index). BMI is a measurement that tells if you are at a healthy weight.  Heart rate and blood pressure.  Body temperature.  Skin for abnormal spots. Counseling Your health care provider may ask you questions about your:  Past medical problems.  Family's medical history.  Alcohol, tobacco, and drug use.  Emotional well-being.  Home life and relationship well-being.  Sexual activity.  Diet, exercise, and sleep habits.  History of falls.  Memory and ability to understand (cognition).  Work and work Statistician.  Access to firearms. What immunizations do I need? Vaccines are usually given at various ages, according to a schedule. Your health care provider will recommend vaccines for you based on your age, medical history, and lifestyle or other factors, such as travel or where you work.   What tests do I need? Blood tests  Lipid and cholesterol levels. These may be checked every 5 years, or more often depending on your overall health.  Hepatitis C test.  Hepatitis B  test. Screening  Lung cancer screening. You may have this screening every year starting at age 75 if you have a 30-pack-year history of smoking and currently smoke or have quit within the past 15 years.  Colorectal cancer screening. ? All adults should have this screening starting at age 8 and continuing until age 68. ? Your health care provider may recommend screening at age 53 if you are at increased risk. ? You will have tests every 1-10 years, depending on your results and the type of screening test.  Prostate cancer screening. Recommendations will vary depending on your family history and other risks.  Genital exam to check for testicular cancer or hernias.  Diabetes screening. ? This is done by checking your blood sugar (glucose) after you have not eaten for a while (fasting). ? You may have this done every 1-3 years.  Abdominal aortic aneurysm (AAA) screening. You may need this if you are a current or former smoker.  STD (sexually transmitted disease) testing, if you are at risk. Follow these instructions at home: Eating and drinking  Eat a diet that includes fresh fruits and vegetables, whole grains, lean protein, and low-fat dairy products. Limit your intake of foods with high amounts of sugar, saturated fats, and salt.  Take vitamin and mineral supplements as recommended by your health care provider.  Do not drink alcohol if your health care provider tells you not to drink.  If you drink alcohol: ? Limit how much you have to 0-2 drinks a day. ? Be aware of how much alcohol is in your drink. In the [place] one  drink equals one 12 oz bottle of beer (355 mL), one 5 oz glass of wine (148 mL), or one 1 oz glass of hard liquor (44 mL).   Lifestyle  Take daily care of your teeth and gums. Brush your teeth every morning and night with fluoride toothpaste. Floss one time each day.  Stay active. Exercise for at least 30 minutes 5 or more days each week.  Do not use any products  that contain nicotine or tobacco, such as cigarettes, e-cigarettes, and chewing tobacco. If you need help quitting, ask your health care provider.  Do not use drugs.  If you are sexually active, practice safe sex. Use a condom or other form of protection to prevent STIs (sexually transmitted infections).  Talk with your health care provider about taking a low-dose aspirin or statin.  Find healthy ways to cope with stress, such as: ? Meditation, yoga, or listening to music. ? Journaling. ? Talking to a trusted person. ? Spending time with friends and family. Safety  Always wear your seat belt while driving or riding in a vehicle.  Do not drive: ? If you have been drinking alcohol. Do not ride with someone who has been drinking. ? When you are tired or distracted. ? While texting.  Wear a helmet and other protective equipment during sports activities.  If you have firearms in your house, make sure you follow all gun safety procedures. What's next?  Visit your health care provider once a year for an annual wellness visit.  Ask your health care provider how often you should have your eyes and teeth checked.  Stay up to date on all vaccines. This information is not intended to replace advice given to you by your health care provider. Make sure you discuss any questions you have with your health care provider. Document Revised: 10/20/2018 Document Reviewed: 01/15/2018 Elsevier Patient Education  2021 [employer].

## 2020-03-27 DIAGNOSIS — E782 Mixed hyperlipidemia: Secondary | ICD-10-CM | POA: Diagnosis not present

## 2020-03-27 DIAGNOSIS — E559 Vitamin D deficiency, unspecified: Secondary | ICD-10-CM | POA: Diagnosis not present

## 2020-03-27 DIAGNOSIS — R7303 Prediabetes: Secondary | ICD-10-CM | POA: Diagnosis not present

## 2020-03-27 DIAGNOSIS — Z0000 Encounter for general adult medical examination without abnormal findings: Secondary | ICD-10-CM | POA: Diagnosis not present

## 2020-03-28 ENCOUNTER — Encounter: Payer: Medicare Other | Admitting: Family Medicine

## 2020-03-28 ENCOUNTER — Other Ambulatory Visit: Payer: Self-pay

## 2020-03-28 ENCOUNTER — Encounter: Payer: Self-pay | Admitting: Family Medicine

## 2020-03-28 LAB — COMPREHENSIVE METABOLIC PANEL
ALT: 21 IU/L (ref 0–44)
AST: 30 IU/L (ref 0–40)
Albumin/Globulin Ratio: 1.4 (ref 1.2–2.2)
Albumin: 4.5 g/dL (ref 3.7–4.7)
Alkaline Phosphatase: 62 IU/L (ref 44–121)
BUN/Creatinine Ratio: 13 (ref 10–24)
BUN: 15 mg/dL (ref 8–27)
Bilirubin Total: 0.5 mg/dL (ref 0.0–1.2)
CO2: 23 mmol/L (ref 20–29)
Calcium: 9.7 mg/dL (ref 8.6–10.2)
Chloride: 102 mmol/L (ref 96–106)
Creatinine, Ser: 1.15 mg/dL (ref 0.76–1.27)
GFR calc Af Amer: 70 mL/min/{1.73_m2} (ref >59)
GFR calc non Af Amer: 60 mL/min/{1.73_m2} (ref >59)
Globulin, Total: 3.2 g/dL (ref 1.5–4.5)
Glucose: 94 mg/dL (ref 65–99)
Potassium: 5 mmol/L (ref 3.5–5.2)
Sodium: 141 mmol/L (ref 134–144)
Total Protein: 7.7 g/dL (ref 6.0–8.5)

## 2020-03-28 LAB — LIPID PANEL
Chol/HDL Ratio: 5.9 ratio — ABNORMAL HIGH (ref 0.0–5.0)
Cholesterol, Total: 213 mg/dL — ABNORMAL HIGH (ref 100–199)
HDL: 36 mg/dL — ABNORMAL LOW (ref >39)
LDL Chol Calc (NIH): 152 mg/dL — ABNORMAL HIGH (ref 0–99)
Triglycerides: 139 mg/dL (ref 0–149)
VLDL Cholesterol Cal: 25 mg/dL (ref 5–40)

## 2020-03-28 LAB — HEMOGLOBIN A1C
Est. average glucose Bld gHb Est-mCnc: 120 mg/dL
Hgb A1c MFr Bld: 5.8 % — ABNORMAL HIGH (ref 4.8–5.6)

## 2020-03-28 LAB — VITAMIN D 25 HYDROXY (VIT D DEFICIENCY, FRACTURES): Vit D, 25-Hydroxy: 43.2 ng/mL (ref 30.0–100.0)

## 2020-03-28 NOTE — Patient Instructions (Signed)

## 2020-03-28 NOTE — Progress Notes (Deleted)
     Established patient visit   Patient: Gregory Arroyo   DOB: [birth year]   80 y.o. Male  MRN: [id number] Visit Date: 03/28/2020  Today's healthcare provider: Lavon Paganini, MD   Chief Complaint  Patient presents with  . Cerumen Impaction   Subjective    HPI  ***  Patient Active Problem List   Diagnosis Date Noted  . Mixed hyperlipidemia 03/21/2020  . Avitaminosis D 03/21/2020  . Bilateral impacted cerumen 03/21/2020  . History of skin cancer 04/19/2019  . Reactive airway disease 05/08/2015  . H/O adenomatous polyp of colon 08/29/2014  . Pre-diabetes 08/29/2014  . Skin lesion of face 08/29/2014  . Vertigo 08/29/2014  . External hemorrhoids without complication 18/29/9371  . Testicular hypofunction 05/06/2008  . Family history of colon cancer 04/28/2008  . Allergic rhinitis 04/27/2008  . Decreased libido 04/27/2008   Social History   Tobacco Use  . Smoking status: Former Smoker    Packs/day: 1.00    Years: 2.00    Pack years: 2.00    Types: Cigarettes    Quit date: 02/04/1958    Years since quitting: 62.1  . Smokeless tobacco: Never Used  Vaping Use  . Vaping Use: Never used  Substance Use Topics  . Alcohol use: Yes    Alcohol/week: 1.0 standard drink    Types: 1 Glasses of wine per week  . Drug use: No   Allergies  Allergen Reactions  . Pollen Extract        Medications: Outpatient Medications Prior to Visit  Medication Sig  . aspirin 81 MG tablet Take 1 tablet by mouth daily.  . Cetirizine HCl 10 MG CAPS Take 1 tablet by mouth daily.   . fluticasone (FLONASE) 50 MCG/ACT nasal spray Place 2 sprays into both nostrils daily. (Patient taking differently: Place 2 sprays into both nostrils daily. As needed)  . loratadine (CLARITIN) 10 MG tablet Take 1 tablet by mouth daily as needed.  . Multiple Vitamins-Minerals (SENIOR MULTIVITAMIN PLUS PO) Take by mouth daily.  . Omega-3 Fatty Acids (FISH OIL PO) Take 1 capsule by mouth daily.  Marland Kitchen XIIDRA 5 % SOLN  Instill 1 drop into both eyes twice a day   No facility-administered medications prior to visit.    Review of Systems  Constitutional: Negative for chills and fatigue.  HENT: Positive for ear pain and hearing loss. Negative for congestion, ear discharge and tinnitus.   Respiratory: Negative for cough.     Last CBC Lab Results  Component Value Date   WBC 5.8 06/18/2019   HGB 14.6 06/18/2019   HCT 45.5 06/18/2019   MCV 95 06/18/2019   MCH 30.6 06/18/2019   RDW 12.5 06/18/2019   PLT 321 01/23/2019        Objective    BP 117/69 (BP Location: Left Arm, Patient Position: Sitting, Cuff Size: Normal)   Pulse 71   Temp 98 F (36.7 C) (Oral)   Resp 16   Wt 150 lb (68 kg)   BMI 24.21 kg/m  BP Readings from Last 3 Encounters:  03/28/20 117/69  03/21/20 140/75  06/18/19 140/81      Physical Exam  ***  No results found for any visits on 03/28/20.  Assessment & Plan     ***  No follow-ups on file.      {provider attestation***:1}   Lavon Paganini, MD  [facility] [phone] (phone) [phone] (fax)  [place]

## 2020-03-29 NOTE — Progress Notes (Signed)
Appt canceled.  This encounter was created in error - please disregard. 

## 2020-04-03 ENCOUNTER — Ambulatory Visit (INDEPENDENT_AMBULATORY_CARE_PROVIDER_SITE_OTHER): Payer: Medicare Other | Admitting: Physician Assistant

## 2020-04-03 ENCOUNTER — Encounter: Payer: Self-pay | Admitting: Physician Assistant

## 2020-04-03 ENCOUNTER — Other Ambulatory Visit: Payer: Self-pay

## 2020-04-03 VITALS — BP 138/84 | HR 70 | Temp 98.2°F | Wt 152.1 lb

## 2020-04-03 DIAGNOSIS — H6091 Unspecified otitis externa, right ear: Secondary | ICD-10-CM

## 2020-04-03 MED ORDER — OFLOXACIN 0.3 % OT SOLN: 10.0000 [drp] | Freq: Every day | OTIC | 0 refills | Status: AC

## 2020-04-03 NOTE — Progress Notes (Signed)
Established patient visit   Patient: Gregory Arroyo   DOB: Jan 10, 1941   80 y.o. Male  MRN: [id number] Visit Date: 04/03/2020  Today's healthcare provider: Trinna Post, PA-C   Chief Complaint  Patient presents with  . Ear Pain  I,Porsha C McClurkin,acting as a scribe for Trinna Post, PA-C.,have documented all relevant documentation on the behalf of Trinna Post, PA-C,as directed by  Trinna Post, PA-C while in the presence of Trinna Post, PA-C.  Subjective    Otalgia  There is pain in the right ear. This is a new problem. The current episode started in the past 7 days. The problem occurs constantly. The problem has been unchanged. There has been no fever. The pain is at a severity of 6/10. The pain is moderate. Pertinent negatives include no coughing, diarrhea, ear discharge, headaches, hearing loss, rhinorrhea, sore throat or vomiting. He has tried nothing for the symptoms. The treatment provided no relief.  Patient reports he had a ear lavage done on 03/21/2020 and right afterwards is when he started having the ear pain. Also reports some scalp sensitivity on the top of his head when he touches it. Denies temporal pain, jaw claudication or worsening of this symptom since it began.      Medications: Outpatient Medications Prior to Visit  Medication Sig  . aspirin 81 MG tablet Take 1 tablet by mouth daily.  . Cetirizine HCl 10 MG CAPS Take 1 tablet by mouth daily.   . fluticasone (FLONASE) 50 MCG/ACT nasal spray Place 2 sprays into both nostrils daily. (Patient taking differently: Place 2 sprays into both nostrils daily. As needed)  . loratadine (CLARITIN) 10 MG tablet Take 1 tablet by mouth daily as needed.  . Multiple Vitamins-Minerals (SENIOR MULTIVITAMIN PLUS PO) Take by mouth daily.  . Omega-3 Fatty Acids (FISH OIL PO) Take 1 capsule by mouth daily.  Marland Kitchen XIIDRA 5 % SOLN Instill 1 drop into both eyes twice a day   No facility-administered medications  prior to visit.    Review of Systems  HENT: Positive for ear pain. Negative for ear discharge, hearing loss, rhinorrhea and sore throat.   Respiratory: Negative for cough.   Gastrointestinal: Negative for diarrhea and vomiting.  Neurological: Negative for headaches.       Objective    BP (!) 141/73 (BP Location: Left Arm, Patient Position: Sitting, Cuff Size: Normal)   Pulse 70   Temp 98.2 F (36.8 C) (Oral)   Wt 152 lb 1.6 oz (69 kg)   SpO2 98%   BMI 24.55 kg/m     Physical Exam Constitutional:      Appearance: Normal appearance.  HENT:     Head:     Comments: No tenderness to palpation over temporal artery or visible prominence.     Right Ear: Tympanic membrane normal.     Left Ear: Tympanic membrane and ear canal normal.     Ears:     Comments: Erythematous right ear canal.  Skin:    General: Skin is warm and dry.  Neurological:     General: No focal deficit present.     Mental Status: He is alert and oriented to person, place, and time.  Psychiatric:        Mood and Affect: Mood normal.        Behavior: Behavior normal.       No results found for any visits on 04/03/20.  Assessment & Plan  1. Otitis externa of right ear, unspecified chronicity, unspecified type  - ofloxacin (FLOXIN) 0.3 % OTIC solution; Place 10 drops into the right ear daily.  Dispense: 5 mL; Refill: 0   No follow-ups on file.      ITrinna Post, PA-C, have reviewed all documentation for this visit. The documentation on 04/05/20 for the exam, diagnosis, procedures, and orders are all accurate and complete.  The entirety of the information documented in the History of Present Illness, Review of Systems and Physical Exam were personally obtained by me. Portions of this information were initially documented by [facility] and reviewed by me for thoroughness and accuracy.     Paulene Floor  [facility] [phone] (phone) [phone]  (fax)  [place]

## 2020-04-03 NOTE — Patient Instructions (Signed)

## 2020-04-10 DIAGNOSIS — M3500 Sicca syndrome, unspecified: Secondary | ICD-10-CM | POA: Diagnosis not present

## 2020-04-10 DIAGNOSIS — Z87891 Personal history of nicotine dependence: Secondary | ICD-10-CM | POA: Diagnosis not present

## 2020-04-10 DIAGNOSIS — Z79899 Other long term (current) drug therapy: Secondary | ICD-10-CM | POA: Diagnosis not present

## 2020-04-10 DIAGNOSIS — M19049 Primary osteoarthritis, unspecified hand: Secondary | ICD-10-CM | POA: Diagnosis not present

## 2020-04-10 DIAGNOSIS — K121 Other forms of stomatitis: Secondary | ICD-10-CM | POA: Diagnosis not present

## 2020-04-10 DIAGNOSIS — M79604 Pain in right leg: Secondary | ICD-10-CM | POA: Diagnosis not present

## 2020-04-10 DIAGNOSIS — Z7982 Long term (current) use of aspirin: Secondary | ICD-10-CM | POA: Diagnosis not present

## 2020-04-10 DIAGNOSIS — Z85828 Personal history of other malignant neoplasm of skin: Secondary | ICD-10-CM | POA: Diagnosis not present

## 2020-04-10 DIAGNOSIS — M79661 Pain in right lower leg: Secondary | ICD-10-CM | POA: Diagnosis not present

## 2020-04-10 DIAGNOSIS — M1710 Unilateral primary osteoarthritis, unspecified knee: Secondary | ICD-10-CM | POA: Diagnosis not present

## 2020-05-16 ENCOUNTER — Ambulatory Visit
Admission: RE | Admit: 2020-05-16 | Discharge: 2020-05-16 | Disposition: A | Discharge status: Home / Self Care | Payer: Medicare Other | Attending: Adult Health | Admitting: Adult Health

## 2020-05-16 ENCOUNTER — Ambulatory Visit: Payer: Medicare Other

## 2020-05-16 ENCOUNTER — Ambulatory Visit (INDEPENDENT_AMBULATORY_CARE_PROVIDER_SITE_OTHER): Payer: Medicare Other | Admitting: Adult Health

## 2020-05-16 ENCOUNTER — Ambulatory Visit
Admission: RE | Admit: 2020-05-16 | Discharge: 2020-05-16 | Disposition: A | Discharge status: Home / Self Care | Payer: Medicare Other | Source: Ambulatory Visit | Attending: Adult Health | Admitting: Adult Health

## 2020-05-16 ENCOUNTER — Encounter: Payer: Self-pay | Admitting: Adult Health

## 2020-05-16 ENCOUNTER — Other Ambulatory Visit: Payer: Self-pay

## 2020-05-16 VITALS — BP 133/68 | HR 65 | Resp 15 | Wt 147.4 lb

## 2020-05-16 DIAGNOSIS — M545 Low back pain, unspecified: Secondary | ICD-10-CM | POA: Diagnosis not present

## 2020-05-16 DIAGNOSIS — M79605 Pain in left leg: Secondary | ICD-10-CM | POA: Diagnosis not present

## 2020-05-16 DIAGNOSIS — M79661 Pain in right lower leg: Secondary | ICD-10-CM | POA: Insufficient documentation

## 2020-05-16 DIAGNOSIS — M7121 Synovial cyst of popliteal space [Baker], right knee: Secondary | ICD-10-CM

## 2020-05-16 DIAGNOSIS — M79662 Pain in left lower leg: Secondary | ICD-10-CM | POA: Insufficient documentation

## 2020-05-16 DIAGNOSIS — I700 Atherosclerosis of aorta: Secondary | ICD-10-CM | POA: Insufficient documentation

## 2020-05-16 DIAGNOSIS — Z9889 Other specified postprocedural states: Secondary | ICD-10-CM

## 2020-05-16 DIAGNOSIS — M79604 Pain in right leg: Secondary | ICD-10-CM | POA: Diagnosis not present

## 2020-05-16 NOTE — Patient Instructions (Signed)
Leg Cramps Leg cramps occur when one or more muscles tighten and a person has no control over it (involuntary muscle contraction). Muscle cramps are most common in the calf muscles of the leg. They can occur during exercise or at rest. Leg cramps are painful, and they may last for a few seconds to a few minutes. Cramps may return several times before they finally stop. Usually, leg cramps are not caused by a serious medical problem. In many cases, the cause is not known. Some common causes include:  Excessive physical effort (overexertion), such as during intense exercise.  Doing the same motion over and over.  Staying in a certain position for a long period of time.  Improper preparation, form, or technique while doing a sport or an activity.  Dehydration.  Injury.  Side effects of certain medicines.  Abnormally low levels of minerals in your blood (electrolytes), especially potassium and calcium. This could result from: ? Pregnancy. ? Taking diuretic medicines. Follow these instructions at home: Eating and drinking  Drink enough fluid to keep your urine pale yellow. Staying hydrated may help prevent cramps.  Eat a healthy diet that includes plenty of nutrients to help your muscles function. A healthy diet includes fruits and vegetables, lean protein, whole grains, and low-fat or nonfat dairy products. Managing pain, stiffness, and swelling  Try massaging, stretching, and relaxing the affected muscle. Do this for several minutes at a time.  If directed, put ice on areas that are sore or painful after a cramp. To do this: ? Put ice in a plastic bag. ? Place a towel between your skin and the bag. ? Leave the ice on for 20 minutes, 2-3 times a day. ? Remove the ice if your skin turns bright red. This is very important. If you cannot feel pain, heat, or cold, you have a greater risk of damage to the area.  If directed, apply heat to muscles that are tense or tight. Do this before  you exercise, or as often as told by your health care provider. Use the heat source that your health care provider recommends, such as a moist heat pack or a heating pad. To do this: ? Place a towel between your skin and the heat source. ? Leave the heat on for 20-30 minutes. ? Remove the heat if your skin turns bright red. This is especially important if you are unable to feel pain, heat, or cold. You may have a greater risk of getting burned.  Try taking hot showers or baths to help relax tight muscles.      General instructions  If you are having frequent leg cramps, avoid intense exercise for several days.  Take over-the-counter and prescription medicines only as told by your health care provider.  Keep all follow-up visits. This is important. Contact a health care provider if:  Your leg cramps get more severe or more frequent, or they do not improve over time.  Your foot becomes cold, numb, or blue. Summary  Muscle cramps can develop in any muscle, but the most common place is in the calf muscles of the leg.  Leg cramps are painful, and they may last for a few seconds to a few minutes.  Usually, leg cramps are not caused by a serious medical problem. Often, the cause is not known.  Stay hydrated, and take over-the-counter and prescription medicines only as told by your health care provider. This information is not intended to replace advice given to you by your   health care provider. Make sure you discuss any questions you have with your health care provider. Document Revised: 06/09/2019 Document Reviewed: 06/09/2019 Elsevier Patient Education  2021 Elsevier Inc.  

## 2020-05-16 NOTE — Progress Notes (Signed)
Established patient visit   Patient: Gregory Arroyo   DOB: April 27, 1940   80 y.o. Male  MRN: [id number] Visit Date: 05/16/2020  Today's healthcare provider: Marcille Buffy, FNP   Chief Complaint  Patient presents with  . Leg Pain   Subjective    Leg Pain  There was no injury mechanism. The pain is present in the left leg and right leg. The quality of the pain is described as aching. The pain has been fluctuating since onset. Associated symptoms include an inability to bear weight, muscle weakness and tingling (in ankles). Pertinent negatives include no loss of motion, loss of sensation or numbness. The symptoms are aggravated by movement. He has tried NSAIDs (epson salt) for the symptoms. The treatment provided mild relief.    1.5 weeks. No redness or swelling.  Ibuprofen gives mild relief.  Denies any lower back pain or injury. Denies any paresthesia.   Feels like muscular ache that moves around for around 1.5 weeks.   Denies any new activities.  Flonase taking only prescription and only vitamins.  Vitamin D is normal.        Medications: Outpatient Medications Prior to Visit  Medication Sig  . aspirin 81 MG tablet Take 1 tablet by mouth daily.  . Cetirizine HCl 10 MG CAPS Take 1 tablet by mouth daily.   . fluticasone (FLONASE) 50 MCG/ACT nasal spray Place 2 sprays into both nostrils daily. (Patient taking differently: Place 2 sprays into both nostrils daily. As needed)  . loratadine (CLARITIN) 10 MG tablet Take 1 tablet by mouth daily as needed.  . Multiple Vitamins-Minerals (SENIOR MULTIVITAMIN PLUS PO) Take by mouth daily.  Marland Kitchen ofloxacin (FLOXIN) 0.3 % OTIC solution Place 10 drops into the right ear daily.  . Omega-3 Fatty Acids (FISH OIL PO) Take 1 capsule by mouth daily.  Marland Kitchen XIIDRA 5 % SOLN Instill 1 drop into both eyes twice a day   No facility-administered medications prior to visit.    Review of Systems  Neurological: Positive for tingling (in  ankles). Negative for numbness.       Objective    BP 133/68   Pulse 65   Resp 15   Wt 147 lb 6.4 oz (66.9 kg)   SpO2 99%   BMI 23.79 kg/m     Physical Exam Vitals reviewed.  Constitutional:      General: He is not in acute distress.    Appearance: Normal appearance. He is not ill-appearing, toxic-appearing or diaphoretic.  HENT:     Head: Normocephalic and atraumatic.     Right Ear: External ear normal.     Left Ear: External ear normal.  Cardiovascular:     Rate and Rhythm: Normal rate and regular rhythm.     Pulses: Normal pulses.  Pulmonary:     Effort: Pulmonary effort is normal.     Breath sounds: Normal breath sounds.  Musculoskeletal:     Cervical back: Normal range of motion and neck supple.     Right lower leg: Normal. No edema.     Left lower leg: No edema.     Comments: Popliteal cyst felt behind both knees.   Skin:    General: Skin is warm.     Findings: No erythema or rash.  Neurological:     Mental Status: He is oriented to person, place, and time.  Psychiatric:        Mood and Affect: Mood normal.  Behavior: Behavior normal.        Thought Content: Thought content normal.       Results for orders placed or performed in visit on 05/16/20  CBC with Differential/Platelet  Result Value Ref Range   WBC 5.6 3.4 - 10.8 x10E3/uL   RBC 5.14 4.14 - 5.80 x10E6/uL   Hemoglobin 16.4 13.0 - 17.7 g/dL   Hematocrit 48.9 37.5 - 51.0 %   MCV 95 79 - 97 fL   MCH 31.9 26.6 - 33.0 pg   MCHC 33.5 31.5 - 35.7 g/dL   RDW 12.6 11.6 - 15.4 %   Platelets 250 150 - 450 x10E3/uL   Neutrophils 53 Not Estab. %   Lymphs 30 Not Estab. %   Monocytes 14 Not Estab. %   Eos 2 Not Estab. %   Basos 1 Not Estab. %   Neutrophils Absolute 2.9 1.4 - 7.0 x10E3/uL   Lymphocytes Absolute 1.7 0.7 - 3.1 x10E3/uL   Monocytes Absolute 0.8 0.1 - 0.9 x10E3/uL   EOS (ABSOLUTE) 0.1 0.0 - 0.4 x10E3/uL   Basophils Absolute 0.0 0.0 - 0.2 x10E3/uL   Immature Granulocytes 0 Not  Estab. %   Immature Grans (Abs) 0.0 0.0 - 0.1 x10E3/uL  Comprehensive Metabolic Panel (CMET)  Result Value Ref Range   Glucose 102 (H) 65 - 99 mg/dL   BUN 20 8 - 27 mg/dL   Creatinine, Ser 1.16 0.76 - 1.27 mg/dL   eGFR 64 >59 mL/min/1.73   BUN/Creatinine Ratio 17 10 - 24   Sodium 143 134 - 144 mmol/L   Potassium 4.9 3.5 - 5.2 mmol/L   Chloride 103 96 - 106 mmol/L   CO2 19 (L) 20 - 29 mmol/L   Calcium 10.0 8.6 - 10.2 mg/dL   Total Protein 8.1 6.0 - 8.5 g/dL   Albumin 4.9 (H) 3.7 - 4.7 g/dL   Globulin, Total 3.2 1.5 - 4.5 g/dL   Albumin/Globulin Ratio 1.5 1.2 - 2.2   Bilirubin Total 0.4 0.0 - 1.2 mg/dL   Alkaline Phosphatase 63 44 - 121 IU/L   AST 23 0 - 40 IU/L   ALT 17 0 - 44 IU/L    Assessment & Plan     Bilateral leg pain - Plan: CBC with Differential/Platelet, Comprehensive Metabolic Panel (CMET), DG Lumbar Spine Complete, US Venous Img Lower Bilateral (DVT)  History of lumbar discectomy - Plan: DG Lumbar Spine Complete  Popliteal cyst, right No orders of the defined types were placed in this encounter.   Orders Placed This Encounter  Procedures  . DG Lumbar Spine Complete  . US Venous Img Lower Bilateral (DVT)  . CBC with Differential/Platelet  . Comprehensive Metabolic Panel (CMET)     Return in about 3 weeks (around 06/06/2020), or if symptoms worsen or fail to improve, for at any time for any worsening symptoms, Go to Emergency room/ urgent care if worse.      The entirety of the information documented in the History of Present Illness, Review of Systems and Physical Exam were personally obtained by me. Portions of this information were initially documented by the CMA and reviewed by me for thoroughness and accuracy.   Red Flags discussed. The patient was given clear instructions to go to ER or return to medical center if any red flags develop, symptoms do not improve, worsen or new problems develop. They verbalized understanding.    Marcille Buffy,  [place] [phone] (phone) [phone] (fax)  [facility] [place]  [facility]

## 2020-05-17 ENCOUNTER — Other Ambulatory Visit: Payer: Self-pay | Admitting: Adult Health

## 2020-05-17 ENCOUNTER — Encounter: Payer: Self-pay | Admitting: Adult Health

## 2020-05-17 ENCOUNTER — Ambulatory Visit
Admission: RE | Admit: 2020-05-17 | Discharge: 2020-05-17 | Disposition: A | Discharge status: Home / Self Care | Payer: Medicare Other | Source: Ambulatory Visit | Attending: Adult Health | Admitting: Adult Health

## 2020-05-17 DIAGNOSIS — M79605 Pain in left leg: Secondary | ICD-10-CM | POA: Insufficient documentation

## 2020-05-17 DIAGNOSIS — M7121 Synovial cyst of popliteal space [Baker], right knee: Secondary | ICD-10-CM

## 2020-05-17 DIAGNOSIS — M79604 Pain in right leg: Secondary | ICD-10-CM | POA: Diagnosis not present

## 2020-05-17 DIAGNOSIS — M79662 Pain in left lower leg: Secondary | ICD-10-CM | POA: Diagnosis not present

## 2020-05-17 DIAGNOSIS — M479 Spondylosis, unspecified: Secondary | ICD-10-CM | POA: Insufficient documentation

## 2020-05-17 DIAGNOSIS — M79661 Pain in right lower leg: Secondary | ICD-10-CM

## 2020-05-17 DIAGNOSIS — M47896 Other spondylosis, lumbar region: Secondary | ICD-10-CM

## 2020-05-17 MED ORDER — IBUPROFEN 600 MG PO TABS: 600.0000 mg | ORAL_TABLET | Freq: Two times a day (BID) | ORAL | 0 refills | Status: AC | PRN

## 2020-05-17 MED ORDER — CYCLOBENZAPRINE HCL 5 MG PO TABS: 5.0000 mg | ORAL_TABLET | Freq: Every day | ORAL | 0 refills | Status: AC

## 2020-05-17 NOTE — Progress Notes (Signed)
Orders Placed This Encounter  Procedures  . Ambulatory referral to Orthopedic Surgery    Referral Priority:   Urgent    Referral Type:   Surgical    Referral Reason:   Specialty Services Required    Referred to Provider:   Lovell Sheehan, MD    Requested Specialty:   Orthopedic Surgery    Number of Visits Requested:   1   Meds ordered this encounter  Medications  . cyclobenzaprine (FLEXERIL) 5 MG tablet    Sig: Take 1 tablet (5 mg total) by mouth at bedtime. Will cause drowsiness caution advised.    Dispense:  30 tablet    Refill:  0  . ibuprofen (ADVIL) 600 MG tablet    Sig: Take 1 tablet (600 mg total) by mouth 2 (two) times daily as needed for moderate pain or cramping (take with food may cause Gatric upset.).    Dispense:  30 tablet    Refill:  0

## 2020-05-17 NOTE — Progress Notes (Signed)
Osteoarthritis of lower back notes on x ray. Return if any symptoms persist, worsen or changing at anytime for further evaluation.  Provider already called patient.

## 2020-05-17 NOTE — Progress Notes (Signed)
Provider called patient with results from x ray of lumbar spine showing moderate disc narrowing  and ostarthritis of lower lumbar spine.  Also advised of Bakers cyst of the right popliteal fossa 4.8 x 0.9 x 4.1 cm. Will refer him to Dr. Harlow Mares as below.  Sent in prescriptions as below: Muscle relaxer  for questionable mild restless leg at night given for trial and side effects discussed. Also sent prescription Ibuprofen for inflammation/ pain as needed. He is aware he should receive a call from orthopedics within one week and if he has not to inform us.   Return if any symptoms worsening or changing at anytime.   Orders Placed This Encounter Procedures  Ambulatory referral to Orthopedic Surgery   Referral Priority:   Urgent   Referral Type:   Surgical   Referral Reason:   Specialty Services Required   Referred to Provider:   Lovell Sheehan, MD   Requested Specialty:   Orthopedic Surgery   Number of Visits Requested:   1 Meds ordered this encounter Medications cyclobenzaprine (FLEXERIL) 5 MG tablet Sig: Take 1 tablet (5 mg total) by mouth at bedtime. Will cause drowsiness caution advised. Dispense:  30 tablet Refill:  0 ibuprofen (ADVIL) 600 MG tablet   Sig: Take 1 tablet (600 mg total) by mouth 2 (two) times daily as needed for moderate pain or cramping (take with food may cause Gatric upset.).   Dispense:  30 tablet   Refill:  0

## 2020-05-17 NOTE — Progress Notes (Signed)
Patient verbalized understanding of all instructions given and denies any further questions at this time.

## 2020-05-18 LAB — CBC WITH DIFFERENTIAL/PLATELET
Basophils Absolute: 0 10*3/uL (ref 0.0–0.2)
Basos: 1 %
EOS (ABSOLUTE): 0.1 10*3/uL (ref 0.0–0.4)
Eos: 2 %
Hematocrit: 48.9 % (ref 37.5–51.0)
Hemoglobin: 16.4 g/dL (ref 13.0–17.7)
Immature Grans (Abs): 0 10*3/uL (ref 0.0–0.1)
Immature Granulocytes: 0 %
Lymphocytes Absolute: 1.7 10*3/uL (ref 0.7–3.1)
Lymphs: 30 %
MCH: 31.9 pg (ref 26.6–33.0)
MCHC: 33.5 g/dL (ref 31.5–35.7)
MCV: 95 fL (ref 79–97)
Monocytes Absolute: 0.8 10*3/uL (ref 0.1–0.9)
Monocytes: 14 %
Neutrophils Absolute: 2.9 10*3/uL (ref 1.4–7.0)
Neutrophils: 53 %
Platelets: 250 10*3/uL (ref 150–450)
RBC: 5.14 x10E6/uL (ref 4.14–5.80)
RDW: 12.6 % (ref 11.6–15.4)
WBC: 5.6 10*3/uL (ref 3.4–10.8)

## 2020-05-18 LAB — COMPREHENSIVE METABOLIC PANEL
ALT: 17 IU/L (ref 0–44)
AST: 23 IU/L (ref 0–40)
Albumin/Globulin Ratio: 1.5 (ref 1.2–2.2)
Albumin: 4.9 g/dL — ABNORMAL HIGH (ref 3.7–4.7)
Alkaline Phosphatase: 63 IU/L (ref 44–121)
BUN/Creatinine Ratio: 17 (ref 10–24)
BUN: 20 mg/dL (ref 8–27)
Bilirubin Total: 0.4 mg/dL (ref 0.0–1.2)
CO2: 19 mmol/L — ABNORMAL LOW (ref 20–29)
Calcium: 10 mg/dL (ref 8.6–10.2)
Chloride: 103 mmol/L (ref 96–106)
Creatinine, Ser: 1.16 mg/dL (ref 0.76–1.27)
Globulin, Total: 3.2 g/dL (ref 1.5–4.5)
Glucose: 102 mg/dL — ABNORMAL HIGH (ref 65–99)
Potassium: 4.9 mmol/L (ref 3.5–5.2)
Sodium: 143 mmol/L (ref 134–144)
Total Protein: 8.1 g/dL (ref 6.0–8.5)
eGFR: 64 mL/min/{1.73_m2} (ref >59)

## 2020-05-18 NOTE — Progress Notes (Signed)
Labs ok stable no cause for legs to be hurting.

## 2020-05-22 ENCOUNTER — Encounter: Payer: Self-pay | Admitting: Adult Health

## 2020-05-22 DIAGNOSIS — M1612 Unilateral primary osteoarthritis, left hip: Secondary | ICD-10-CM | POA: Diagnosis not present

## 2020-05-23 ENCOUNTER — Telehealth: Payer: Self-pay

## 2020-05-23 NOTE — Telephone Encounter (Signed)
Copied from [place] [id number]. Topic: General - Other >> May 23, 2020 12:42 PM Yvette Rack wrote: Reason for CRM: Gerhard Perches with [facility] stated the prior authorization is approved from April 18,2022 - May 22, 2021. Gerhard Perches stated patient has been notified. Cb# [phone] Option# 5

## 2020-05-23 NOTE — Telephone Encounter (Signed)
Noted  

## 2020-05-23 NOTE — Telephone Encounter (Signed)
FYI

## 2020-06-19 DIAGNOSIS — J019 Acute sinusitis, unspecified: Secondary | ICD-10-CM | POA: Diagnosis not present

## 2020-06-19 DIAGNOSIS — R059 Cough, unspecified: Secondary | ICD-10-CM | POA: Diagnosis not present

## 2020-06-25 DIAGNOSIS — Z20822 Contact with and (suspected) exposure to covid-19: Secondary | ICD-10-CM | POA: Diagnosis not present

## 2020-06-27 ENCOUNTER — Telehealth: Payer: Self-pay

## 2020-06-27 DIAGNOSIS — U071 COVID-19: Secondary | ICD-10-CM

## 2020-06-27 NOTE — Telephone Encounter (Signed)
Are they looking to get treatment?  Do they prefer oral treatment or monoclonal antibodies?  Need to know first day that symptoms started.  We will need to double check that they have a GFR from the last 3 months or we will need to get 1 stat if they are wanting oral treatment.  Also recommend OTC symptom management including Tylenol, honey for cough, Mucinex as needed.

## 2020-06-27 NOTE — Telephone Encounter (Signed)
Copied from [place] [phone]. Topic: General - Other >> Jun 27, 2020  9:58 AM Greggory Keen D wrote: Reason for CRM: Pt's wife called saying she and her husband tested positive for covid on the 22nd.  They are both having cough, congestion, no fever,.  Please advise.  Message in wife's chart also.

## 2020-06-27 NOTE — Telephone Encounter (Signed)
Patient recently tested positive for COVID. Patients wife wants guidance on treatment or possible infusion.  Symptoms started 03/26/2020. Symptoms include congestion, fatigue, productive cough fatigue and head congestion. He had 2 doses on Pfizer vaccine 03/02/2019 and 03/23/2019, then a booster on 11/14/2019. He tested positive on 06/25/2020

## 2020-06-27 NOTE — Telephone Encounter (Signed)
LMTCB 06/27/2020.  PEC please advise pt as below when he calls back.   Thanks,   -Mickel Baas

## 2020-06-28 ENCOUNTER — Telehealth: Payer: Self-pay

## 2020-06-28 ENCOUNTER — Telehealth: Payer: Self-pay | Admitting: Oncology

## 2020-06-28 NOTE — Telephone Encounter (Signed)
Called to Discuss with patient about Covid symptoms and the use of the monoclonal antibody infusion and/or antivirals for those with mild to moderate Covid symptoms and at a high risk of hospitalization.     Pt is qualified for this infusion due to co-morbid conditions and/or a member of an at-risk group.     Patient Active Problem List   Diagnosis Date Noted  . Popliteal cyst, right 05/17/2020  . Spinal osteoarthritis 05/17/2020  . Pain in both lower legs 05/16/2020  . Mixed hyperlipidemia 03/21/2020  . Avitaminosis D 03/21/2020  . Bilateral impacted cerumen 03/21/2020  . History of skin cancer 04/19/2019  . Reactive airway disease 05/08/2015  . H/O adenomatous polyp of colon 08/29/2014  . Pre-diabetes 08/29/2014  . Skin lesion of face 08/29/2014  . Vertigo 08/29/2014  . External hemorrhoids without complication 82/99/3716  . Testicular hypofunction 05/06/2008  . Family history of colon cancer 04/28/2008  . Allergic rhinitis 04/27/2008  . Decreased libido 04/27/2008    Patient declines infusion at this time. Symptoms tier reviewed as well as criteria for ending isolation. Preventative practices reviewed. Patient verbalized understanding.   He is out of the range for oral antivirals and feels much improved.  Patient advised to call back if he/she decides that he/she does want to get infusion. Callback number to the infusion center given. Patient advised to go to Urgent care or ED with severe symptoms.   Faythe Casa, NP 06/28/2020 12:29 PM

## 2020-06-28 NOTE — Telephone Encounter (Signed)
Copied from [place] [id number]. Topic: General - Call Back - No Documentation >> Jun 27, 2020  5:00 PM Erick Blinks wrote: Reason for CRM: Pt would like a call back from the clinic, he returned call and wants to speak to nurse regarding PCP's advice/recommendation. Please advise

## 2020-06-28 NOTE — Telephone Encounter (Signed)
Called to discuss with patient about COVID-19 symptoms and the use of one of the available treatments for those with mild to moderate Covid symptoms and at a high risk of hospitalization.  Pt appears to qualify for outpatient treatment due to co-morbid conditions and/or a member of an at-risk group in accordance with the FDA Emergency Use Authorization.    Symptom onset: Sinus symptoms 06/23/20 Vaccinated: Yes Booster? Yes Immunocompromised? No Qualifiers: Age NIH Criteria: Tier 1  Pt. Would like to speak with APP.   Gregory Arroyo

## 2020-06-29 ENCOUNTER — Other Ambulatory Visit: Payer: Self-pay

## 2020-06-29 DIAGNOSIS — B379 Candidiasis, unspecified: Secondary | ICD-10-CM

## 2020-06-29 NOTE — Telephone Encounter (Signed)
lmtcb if patient or wife returns call please have [place] triage nurse advise of doctors message below and triage. KW

## 2020-06-29 NOTE — Telephone Encounter (Signed)
See TE on 06/28/20 discussing options for antibody infusion from Faythe Casa, NP and Linus Orn, RN.

## 2020-06-29 NOTE — Telephone Encounter (Signed)
See previous telephone encounter. KW

## 2020-07-09 ENCOUNTER — Encounter: Payer: Self-pay | Admitting: Dermatology

## 2020-07-10 MED ORDER — CLOTRIMAZOLE 10 MG MT TROC: 10.0000 mg | Freq: Three times a day (TID) | OROMUCOSAL | 2 refills | Status: AC

## 2020-08-24 DIAGNOSIS — H16042 Marginal corneal ulcer, left eye: Secondary | ICD-10-CM | POA: Diagnosis not present

## 2020-08-28 DIAGNOSIS — H16042 Marginal corneal ulcer, left eye: Secondary | ICD-10-CM | POA: Diagnosis not present

## 2020-09-08 DIAGNOSIS — M3500 Sicca syndrome, unspecified: Secondary | ICD-10-CM | POA: Diagnosis not present

## 2020-09-08 DIAGNOSIS — E785 Hyperlipidemia, unspecified: Secondary | ICD-10-CM | POA: Diagnosis not present

## 2020-09-08 DIAGNOSIS — R7303 Prediabetes: Secondary | ICD-10-CM | POA: Diagnosis not present

## 2020-10-17 ENCOUNTER — Other Ambulatory Visit: Payer: Self-pay

## 2020-10-17 ENCOUNTER — Ambulatory Visit: Payer: Medicare Other | Admitting: Dermatology

## 2020-10-17 DIAGNOSIS — D692 Other nonthrombocytopenic purpura: Secondary | ICD-10-CM

## 2020-10-17 DIAGNOSIS — L853 Xerosis cutis: Secondary | ICD-10-CM

## 2020-10-17 DIAGNOSIS — L570 Actinic keratosis: Secondary | ICD-10-CM

## 2020-10-17 DIAGNOSIS — L578 Other skin changes due to chronic exposure to nonionizing radiation: Secondary | ICD-10-CM

## 2020-10-17 DIAGNOSIS — L814 Other melanin hyperpigmentation: Secondary | ICD-10-CM

## 2020-10-17 DIAGNOSIS — K130 Diseases of lips: Secondary | ICD-10-CM | POA: Diagnosis not present

## 2020-10-17 DIAGNOSIS — Z85828 Personal history of other malignant neoplasm of skin: Secondary | ICD-10-CM

## 2020-10-17 DIAGNOSIS — D1800 Hemangioma unspecified site: Secondary | ICD-10-CM

## 2020-10-17 DIAGNOSIS — D229 Melanocytic nevi, unspecified: Secondary | ICD-10-CM

## 2020-10-17 DIAGNOSIS — Z1283 Encounter for screening for malignant neoplasm of skin: Secondary | ICD-10-CM

## 2020-10-17 DIAGNOSIS — L821 Other seborrheic keratosis: Secondary | ICD-10-CM

## 2020-10-17 NOTE — Progress Notes (Signed)
Follow-Up Visit   Subjective  Gregory Arroyo is a 80 y.o. male who presents for the following: Follow-up (Patient here today for upper body exam. Patient reports no new areas of concern. Patient has history of scc, bcc, and aks. ).  Patient here for upper body skin exam and skin cancer screening.  The following portions of the chart were reviewed this encounter and updated as appropriate:      Objective  Well appearing patient in no apparent distress; mood and affect are within normal limits.  A focused examination was performed including upper extremities, including the arms, hands, fingers, and fingernails. Relevant physical exam findings are noted in the Assessment and Plan.  bilateral arms Xerosis   left cheek x 2, left temple x 4, left upper forehead x 1 (7) Erythematous thin papules/macules with gritty scale.   bilateral lips Clear today   Assessment & Plan  Xerosis cutis bilateral arms  Recommend mild soap and moisturizing cream 1-2 times daily.  Gentle skin care handout provided.     Actinic keratosis (7) left cheek x 2, left temple x 4, left upper forehead x 1  Actinic keratoses are precancerous spots that appear secondary to cumulative UV radiation exposure/sun exposure over time. They are chronic with expected duration over 1 year. A portion of actinic keratoses will progress to squamous cell carcinoma of the skin. It is not possible to reliably predict which spots will progress to skin cancer and so treatment is recommended to prevent development of skin cancer.  Recommend daily broad spectrum sunscreen SPF 30+ to sun-exposed areas, reapply every 2 hours as needed.  Recommend staying in the shade or wearing long sleeves, sun glasses (UVA+UVB protection) and wide brim hats (4-inch brim around the entire circumference of the hat). Call for new or changing lesions.  Destruction of lesion - left cheek x 2, left temple x 4, left upper forehead x 1  Destruction  method: cryotherapy   Informed consent: discussed and consent obtained   Lesion destroyed using liquid nitrogen: Yes   Region frozen until ice ball extended beyond lesion: Yes   Outcome: patient tolerated procedure well with no complications   Post-procedure details: wound care instructions given   Additional details:  Prior to procedure, discussed risks of blister formation, small wound, skin dyspigmentation, or rare scar following cryotherapy. Recommend Vaseline ointment to treated areas while healing.   Cheilitis bilateral lips  Resolved  Continue hydrocortisone 2.5% ointment qd/bid as needed to affected areas of lips prn flares  Lentigines - Scattered tan macules - Due to sun exposure - Benign-appering, observe - Recommend daily broad spectrum sunscreen SPF 30+ to sun-exposed areas, reapply every 2 hours as needed. - Call for any changes  Seborrheic Keratoses - Stuck-on, waxy, tan-brown papules and/or plaques at eyes  - Benign-appearing - Discussed benign etiology and prognosis. - Observe - Call for any changes  Melanocytic Nevi - Tan-brown and/or pink-flesh-colored symmetric macules and papules - Benign appearing on exam today - Observation - Call clinic for new or changing moles - Recommend daily use of broad spectrum spf 30+ sunscreen to sun-exposed areas.   Hemangiomas - Red papules - Discussed benign nature - Observe - Call for any changes  Purpura - Chronic; persistent and recurrent.  Treatable, but not curable. - Violaceous macules and patches at arms  - Benign - Related to trauma, age, sun damage and/or use of blood thinners, chronic use of topical and/or oral steroids - Observe - Can use OTC  arnica containing moisturizer such as Dermend Bruise Formula if desired - Call for worsening or other concerns  Actinic Damage - Chronic condition, secondary to cumulative UV/sun exposure - diffuse scaly erythematous macules with underlying dyspigmentation -  Recommend daily broad spectrum sunscreen SPF 30+ to sun-exposed areas, reapply every 2 hours as needed.  - Staying in the shade or wearing long sleeves, sun glasses (UVA+UVB protection) and wide brim hats (4-inch brim around the entire circumference of the hat) are also recommended for sun protection.  - Call for new or changing lesions.  History of Basal Cell Carcinoma of the Skin - No evidence of recurrence today right antihelix  - Recommend regular full body skin exams - Recommend daily broad spectrum sunscreen SPF 30+ to sun-exposed areas, reapply every 2 hours as needed.  - Call if any new or changing lesions are noted between office visits  History of Squamous Cell Carcinoma of the Skin - No evidence of recurrence today left ear concha  - Recommend regular full body skin exams - Recommend daily broad spectrum sunscreen SPF 30+ to sun-exposed areas, reapply every 2 hours as needed.  - Call if any new or changing lesions are noted between office visits  Skin cancer screening performed today.  Return in about 1 year (around 10/17/2021) for upper body exam . I, Ruthell Rummage, CMA, am acting as scribe for Brendolyn Patty, MD.  Documentation: I have reviewed the above documentation for accuracy and completeness, and I agree with the above.  Brendolyn Patty MD

## 2020-10-17 NOTE — Patient Instructions (Addendum)
Gentle Skin Care Guide  1. Bathe no more than once a day.  2. Avoid bathing in hot water  3. Use a mild soap like Dove, Vanicream, Cetaphil, CeraVe. Can use Lever 2000 or Cetaphil antibacterial soap  4. Use soap only where you need it. On most days, use it under your arms, between your legs, and on your feet. Let the water rinse other areas unless visibly dirty.  5. When you get out of the bath/shower, use a towel to gently blot your skin dry, don't rub it.  6. While your skin is still a little damp, apply a moisturizing cream such as Vanicream, CeraVe, Cetaphil, Eucerin, Sarna lotion or plain Vaseline Jelly. For hands apply Neutrogena [place] Hand Cream or Excipial Hand Cream.  7. Reapply moisturizer any time you start to itch or feel dry.  8. Sometimes using free and clear laundry detergents can be helpful. Fabric softener sheets should be avoided. Downy Free & Gentle liquid, or any liquid fabric softener that is free of dyes and perfumes, it acceptable to use  9. If your doctor has given you prescription creams you may apply moisturizers over them   Actinic keratoses are precancerous spots that appear secondary to cumulative UV radiation exposure/sun exposure over time. They are chronic with expected duration over 1 year. A portion of actinic keratoses will progress to squamous cell carcinoma of the skin. It is not possible to reliably predict which spots will progress to skin cancer and so treatment is recommended to prevent development of skin cancer.  Recommend daily broad spectrum sunscreen SPF 30+ to sun-exposed areas, reapply every 2 hours as needed.  Recommend staying in the shade or wearing long sleeves, sun glasses (UVA+UVB protection) and wide brim hats (4-inch brim around the entire circumference of the hat). Call for new or changing lesions.   Cryotherapy Aftercare  Wash gently with soap and water everyday.   Apply Vaseline and Band-Aid daily until healed.    Melanoma  ABCDEs  Melanoma is the most dangerous type of skin cancer, and is the leading cause of death from skin disease.  You are more likely to develop melanoma if you: Have light-colored skin, light-colored eyes, or red or blond hair Spend a lot of time in the sun Tan regularly, either outdoors or in a tanning bed Have had blistering sunburns, especially during childhood Have a close family member who has had a melanoma Have atypical moles or large birthmarks  Early detection of melanoma is key since treatment is typically straightforward and cure rates are extremely high if we catch it early.   The first sign of melanoma is often a change in a mole or a new dark spot.  The ABCDE system is a way of remembering the signs of melanoma.  A for asymmetry:  The two halves do not match. B for border:  The edges of the growth are irregular. C for color:  A mixture of colors are present instead of an even brown color. D for diameter:  Melanomas are usually (but not always) greater than 80m - the size of a pencil eraser. E for evolution:  The spot keeps changing in size, shape, and color.  Please check your skin once per month between visits. You can use a small mirror in front and a large mirror behind you to keep an eye on the back side or your body.   If you see any new or changing lesions before your next follow-up, please call to schedule  a visit.  Please continue daily skin protection including broad spectrum sunscreen SPF 30+ to sun-exposed areas, reapplying every 2 hours as needed when you're outdoors.   Staying in the shade or wearing long sleeves, sun glasses (UVA+UVB protection) and wide brim hats (4-inch brim around the entire circumference of the hat) are also recommended for sun protection.    If you have any questions or concerns for your doctor, please call our main line at [phone] and press option 4 to reach your doctor's medical assistant. If no one answers, please leave a  voicemail as directed and we will return your call as soon as possible. Messages left after 4 pm will be answered the following business day.   You may also send [place] a message via [place]. We typically respond to MyChart messages within 1-2 business days.  For prescription refills, please ask your pharmacy to contact our office. Our fax number is [phone].  If you have an urgent issue when the clinic is closed that cannot wait until the next business day, you can page your doctor at the number below.    Please note that while we do our best to be available for urgent issues outside of office hours, we are not available 24/7.   If you have an urgent issue and are unable to reach [place], you may choose to seek medical care at your doctor's office, retail clinic, urgent care center, or emergency room.  If you have a medical emergency, please immediately call 911 or go to the emergency department.  Pager Numbers  - Dr. Nehemiah Massed: [phone]  - Dr. Laurence Ferrari: [phone]  - Dr. Nicole Kindred: [phone]  In the event of inclement weather, please call our main line at [phone] for an update on the status of any delays or closures.  Dermatology Medication Tips: Please keep the boxes that topical medications come in in order to help keep track of the instructions about where and how to use these. Pharmacies typically print the medication instructions only on the boxes and not directly on the medication tubes.   If your medication is too expensive, please contact our office at [phone] option 4 or send [place] a message through [place].   We are unable to tell what your co-pay for medications will be in advance as this is different depending on your insurance coverage. However, we may be able to find a substitute medication at lower cost or fill out paperwork to get insurance to cover a needed medication.   If a prior authorization is required to get your medication covered by your insurance  company, please allow [place] 1-2 business days to complete this process.  Drug prices often vary depending on where the prescription is filled and some pharmacies may offer cheaper prices.  The website www.goodrx.com contains coupons for medications through different pharmacies. The prices here do not account for what the cost may be with help from insurance (it may be cheaper with your insurance), but the website can give you the price if you did not use any insurance.  - You can print the associated coupon and take it with your prescription to the pharmacy.  - You may also stop by our office during regular business hours and pick up a GoodRx coupon card.  - If you need your prescription sent electronically to a different pharmacy, notify our office through [facility] or by phone at [phone] option 4.

## 2021-02-26 DIAGNOSIS — J101 Influenza due to other identified influenza virus with other respiratory manifestations: Secondary | ICD-10-CM | POA: Diagnosis not present

## 2021-02-26 DIAGNOSIS — R6889 Other general symptoms and signs: Secondary | ICD-10-CM | POA: Diagnosis not present

## 2021-03-12 DIAGNOSIS — H6121 Impacted cerumen, right ear: Secondary | ICD-10-CM | POA: Diagnosis not present

## 2021-03-15 DIAGNOSIS — H04123 Dry eye syndrome of bilateral lacrimal glands: Secondary | ICD-10-CM | POA: Diagnosis not present

## 2021-03-23 ENCOUNTER — Encounter: Payer: Self-pay | Admitting: Family Medicine

## 2021-03-27 DIAGNOSIS — R7303 Prediabetes: Secondary | ICD-10-CM | POA: Diagnosis not present

## 2021-03-27 DIAGNOSIS — E782 Mixed hyperlipidemia: Secondary | ICD-10-CM | POA: Diagnosis not present

## 2021-03-27 DIAGNOSIS — M3500 Sicca syndrome, unspecified: Secondary | ICD-10-CM | POA: Diagnosis not present

## 2021-04-03 DIAGNOSIS — E785 Hyperlipidemia, unspecified: Secondary | ICD-10-CM | POA: Diagnosis not present

## 2021-04-03 DIAGNOSIS — Z0000 Encounter for general adult medical examination without abnormal findings: Secondary | ICD-10-CM | POA: Diagnosis not present

## 2021-04-03 DIAGNOSIS — R7303 Prediabetes: Secondary | ICD-10-CM | POA: Diagnosis not present

## 2021-05-10 DIAGNOSIS — J019 Acute sinusitis, unspecified: Secondary | ICD-10-CM | POA: Diagnosis not present

## 2021-05-10 DIAGNOSIS — R7303 Prediabetes: Secondary | ICD-10-CM | POA: Diagnosis not present

## 2021-07-25 ENCOUNTER — Encounter: Admission: EM | Disposition: E | Payer: Self-pay | Source: Home / Self Care | Attending: Cardiology

## 2021-07-25 ENCOUNTER — Other Ambulatory Visit: Payer: Self-pay

## 2021-07-25 ENCOUNTER — Inpatient Hospital Stay
Admission: EM | Admit: 2021-07-25 | Discharge: 2021-08-04 | DRG: 247 | Disposition: E | Payer: Medicare Other | Attending: Cardiology | Admitting: Cardiology

## 2021-07-25 ENCOUNTER — Emergency Department: Payer: Medicare Other

## 2021-07-25 DIAGNOSIS — Z8249 Family history of ischemic heart disease and other diseases of the circulatory system: Secondary | ICD-10-CM

## 2021-07-25 DIAGNOSIS — Z85828 Personal history of other malignant neoplasm of skin: Secondary | ICD-10-CM

## 2021-07-25 DIAGNOSIS — R451 Restlessness and agitation: Secondary | ICD-10-CM | POA: Diagnosis not present

## 2021-07-25 DIAGNOSIS — I462 Cardiac arrest due to underlying cardiac condition: Secondary | ICD-10-CM | POA: Diagnosis present

## 2021-07-25 DIAGNOSIS — I959 Hypotension, unspecified: Secondary | ICD-10-CM | POA: Diagnosis not present

## 2021-07-25 DIAGNOSIS — I4901 Ventricular fibrillation: Secondary | ICD-10-CM | POA: Diagnosis not present

## 2021-07-25 DIAGNOSIS — T17918A Gastric contents in respiratory tract, part unspecified causing other injury, initial encounter: Secondary | ICD-10-CM | POA: Diagnosis not present

## 2021-07-25 DIAGNOSIS — Z91048 Other nonmedicinal substance allergy status: Secondary | ICD-10-CM

## 2021-07-25 DIAGNOSIS — I2510 Atherosclerotic heart disease of native coronary artery without angina pectoris: Secondary | ICD-10-CM | POA: Diagnosis present

## 2021-07-25 DIAGNOSIS — Z87891 Personal history of nicotine dependence: Secondary | ICD-10-CM

## 2021-07-25 DIAGNOSIS — Z79899 Other long term (current) drug therapy: Secondary | ICD-10-CM

## 2021-07-25 DIAGNOSIS — I213 ST elevation (STEMI) myocardial infarction of unspecified site: Principal | ICD-10-CM

## 2021-07-25 DIAGNOSIS — R112 Nausea with vomiting, unspecified: Secondary | ICD-10-CM | POA: Diagnosis not present

## 2021-07-25 DIAGNOSIS — I471 Supraventricular tachycardia: Secondary | ICD-10-CM | POA: Diagnosis not present

## 2021-07-25 DIAGNOSIS — I2119 ST elevation (STEMI) myocardial infarction involving other coronary artery of inferior wall: Principal | ICD-10-CM | POA: Diagnosis present

## 2021-07-25 DIAGNOSIS — Z7982 Long term (current) use of aspirin: Secondary | ICD-10-CM

## 2021-07-25 DIAGNOSIS — E782 Mixed hyperlipidemia: Secondary | ICD-10-CM | POA: Diagnosis present

## 2021-07-25 LAB — CBC WITH DIFFERENTIAL/PLATELET
Abs Immature Granulocytes: 0.04 10*3/uL (ref 0.00–0.07)
Basophils Absolute: 0 10*3/uL (ref 0.0–0.1)
Basophils Relative: 0 %
Eosinophils Absolute: 0.1 10*3/uL (ref 0.0–0.5)
Eosinophils Relative: 2 %
HCT: 44 % (ref 39.0–52.0)
Hemoglobin: 14.2 g/dL (ref 13.0–17.0)
Immature Granulocytes: 1 %
Lymphocytes Relative: 29 %
Lymphs Abs: 2.3 10*3/uL (ref 0.7–4.0)
MCH: 30.8 pg (ref 26.0–34.0)
MCHC: 32.3 g/dL (ref 30.0–36.0)
MCV: 95.4 fL (ref 80.0–100.0)
Monocytes Absolute: 0.8 10*3/uL (ref 0.1–1.0)
Monocytes Relative: 10 %
Neutro Abs: 4.7 10*3/uL (ref 1.7–7.7)
Neutrophils Relative %: 58 %
Platelets: 242 10*3/uL (ref 150–400)
RBC: 4.61 MIL/uL (ref 4.22–5.81)
RDW: 13.2 % (ref 11.5–15.5)
WBC: 7.9 10*3/uL (ref 4.0–10.5)
nRBC: 0 % (ref 0.0–0.2)

## 2021-07-25 LAB — COMPREHENSIVE METABOLIC PANEL
ALT: 27 U/L (ref 0–44)
AST: 33 U/L (ref 15–41)
Albumin: 3.6 g/dL (ref 3.5–5.0)
Alkaline Phosphatase: 43 U/L (ref 38–126)
Anion gap: 7 (ref 5–15)
BUN: 28 mg/dL — ABNORMAL HIGH (ref 8–23)
CO2: 26 mmol/L (ref 22–32)
Calcium: 9 mg/dL (ref 8.9–10.3)
Chloride: 105 mmol/L (ref 98–111)
Creatinine, Ser: 1.47 mg/dL — ABNORMAL HIGH (ref 0.61–1.24)
GFR, Estimated: 48 mL/min — ABNORMAL LOW (ref >60)
Glucose, Bld: 174 mg/dL — ABNORMAL HIGH (ref 70–99)
Potassium: 4.1 mmol/L (ref 3.5–5.1)
Sodium: 138 mmol/L (ref 135–145)
Total Bilirubin: 0.5 mg/dL (ref 0.3–1.2)
Total Protein: 7.3 g/dL (ref 6.5–8.1)

## 2021-07-25 LAB — APTT: aPTT: 25 seconds (ref 24–36)

## 2021-07-25 LAB — PROTIME-INR
INR: 1 (ref 0.8–1.2)
Prothrombin Time: 13.3 seconds (ref 11.4–15.2)

## 2021-07-25 LAB — TROPONIN I (HIGH SENSITIVITY): Troponin I (High Sensitivity): 408 ng/L (ref <18)

## 2021-07-25 LAB — LIPID PANEL
Cholesterol: 180 mg/dL (ref 0–200)
HDL: 31 mg/dL — ABNORMAL LOW (ref >40)
LDL Cholesterol: 99 mg/dL (ref 0–99)
Total CHOL/HDL Ratio: 5.8 RATIO
Triglycerides: 250 mg/dL — ABNORMAL HIGH (ref <150)
VLDL: 50 mg/dL — ABNORMAL HIGH (ref 0–40)

## 2021-07-25 SURGERY — CORONARY/GRAFT ACUTE MI REVASCULARIZATION
Anesthesia: Moderate Sedation

## 2021-07-25 MED ORDER — SODIUM CHLORIDE 0.9 % IV SOLN: INTRAVENOUS | Status: DC

## 2021-07-25 MED ORDER — HEPARIN SODIUM (PORCINE) 1000 UNIT/ML IJ SOLN
INTRAMUSCULAR | Status: DC | PRN
  Administered 2021-07-25 – 2021-07-26 (×3): 5000 [IU] via INTRAVENOUS

## 2021-07-25 MED ORDER — SODIUM CHLORIDE 0.9 % IV BOLUS
500.0000 mL | Freq: Once | INTRAVENOUS | Status: AC
  Administered 2021-07-25: 500 mL via INTRAVENOUS

## 2021-07-25 MED ORDER — MIDAZOLAM HCL 2 MG/2ML IJ SOLN
INTRAMUSCULAR | Status: DC | PRN
  Administered 2021-07-25: 1 mg via INTRAVENOUS

## 2021-07-25 MED ORDER — ASPIRIN 81 MG PO CHEW: 324.0000 mg | CHEWABLE_TABLET | Freq: Once | ORAL | Status: DC

## 2021-07-25 MED ORDER — FENTANYL CITRATE (PF) 100 MCG/2ML IJ SOLN
INTRAMUSCULAR | Status: DC | PRN
  Administered 2021-07-25: 25 ug via INTRAVENOUS

## 2021-07-25 MED ORDER — NOREPINEPHRINE BITARTRATE 1 MG/ML IV SOLN
INTRAVENOUS | Status: AC | PRN
  Administered 2021-07-25: 20 ug/min via INTRAVENOUS

## 2021-07-25 MED ORDER — HEPARIN (PORCINE) IN NACL 2000-0.9 UNIT/L-% IV SOLN
INTRAVENOUS | Status: DC | PRN
  Administered 2021-07-25: 1000 mL

## 2021-07-25 MED ORDER — VERAPAMIL HCL 2.5 MG/ML IV SOLN
INTRAVENOUS | Status: DC | PRN
  Administered 2021-07-25: 2.5 mg via INTRA_ARTERIAL

## 2021-07-25 MED ORDER — HEPARIN BOLUS VIA INFUSION
4000.0000 [IU] | Freq: Once | INTRAVENOUS | Status: AC
  Administered 2021-07-25: 4000 [IU] via INTRAVENOUS

## 2021-07-25 MED ORDER — LIDOCAINE HCL (PF) 1 % IJ SOLN
INTRAMUSCULAR | Status: DC | PRN
  Administered 2021-07-25: 2 mL

## 2021-07-25 MED ORDER — DOPAMINE-DEXTROSE 3.2-5 MG/ML-% IV SOLN
INTRAVENOUS | Status: AC | PRN
  Administered 2021-07-25: 10 ug/kg/min via INTRAVENOUS

## 2021-07-25 SURGICAL SUPPLY — 26 items
BALLN TREK RX 2.5X15 (BALLOONS) ×2
BALLOON TREK RX 2.5X15 (BALLOONS) IMPLANT
BAND ZEPHYR COMPRESS 30 LONG (HEMOSTASIS) ×1 IMPLANT
CABLE ADAPT PACING TEMP 12FT (ADAPTER) ×1 IMPLANT
CATH LAUNCHER 6FR EBU3.5 (CATHETERS) ×1 IMPLANT
CATH LAUNCHER 6FR JR4 (CATHETERS) ×1 IMPLANT
CATH S G BIP PACING (CATHETERS) ×2 IMPLANT
CATH VISTA GUIDE 6FR XB3.5 (CATHETERS) ×1 IMPLANT
DRAPE BRACHIAL (DRAPES) ×1 IMPLANT
GLIDESHEATH SLEND SS 6F .021 (SHEATH) ×1 IMPLANT
GUIDEWIRE INQWIRE 1.5J.035X260 (WIRE) IMPLANT
INQWIRE 1.5J .035X260CM (WIRE) ×4
KIT ENCORE 26 ADVANTAGE (KITS) ×1 IMPLANT
NDL PERC 18GX7CM (NEEDLE) IMPLANT
NEEDLE PERC 18GX7CM (NEEDLE) ×2 IMPLANT
PACK CARDIAC CATH (CUSTOM PROCEDURE TRAY) ×2 IMPLANT
PROTECTION STATION PRESSURIZED (MISCELLANEOUS) ×2
SET ATX SIMPLICITY (MISCELLANEOUS) ×1 IMPLANT
SHEATH AVANTI 6FR X 11CM (SHEATH) ×1 IMPLANT
SLEEVE REPOSITIONING LENGTH 30 (MISCELLANEOUS) ×1 IMPLANT
STATION PROTECTION PRESSURIZED (MISCELLANEOUS) IMPLANT
STENT ONYX FRONTIER 3.0X15 (Permanent Stent) ×1 IMPLANT
TUBING CIL FLEX 10 FLL-RA (TUBING) ×1 IMPLANT
WIRE ASAHI PROWATER 180CM (WIRE) ×1 IMPLANT
WIRE G HI TQ BMW 190 (WIRE) ×1 IMPLANT
WIRE RUNTHROUGH .014X180CM (WIRE) ×1 IMPLANT

## 2021-07-25 NOTE — ED Notes (Signed)
Pt family given shirt pt had no pants underwear or shoes

## 2021-07-25 NOTE — ED Triage Notes (Signed)
Pt got up to use bathroom and had bilateral leg weakness with substernal chest pain pt denies nausea or vomiting. Pt initial was systolic 26'R 485IO of fluids given 127/77 HR 62 98% 2L. Pain had went from 10 to 5. Pt took 850 ASA. Pt also got 0.4 sublingual nitro from EMS after pressure came up.

## 2021-07-25 NOTE — ED Notes (Signed)
Austin RN placed pt on pads. Tiffany, RN drawing blood.

## 2021-07-25 NOTE — ED Notes (Signed)
RN transporting pt to cath lab on [address]

## 2021-07-25 NOTE — ED Notes (Signed)
Cardiologist at bedside.  

## 2021-07-26 ENCOUNTER — Encounter: Payer: Self-pay | Admitting: Cardiology

## 2021-07-26 DIAGNOSIS — Z79899 Other long term (current) drug therapy: Secondary | ICD-10-CM | POA: Diagnosis not present

## 2021-07-26 DIAGNOSIS — T17918A Gastric contents in respiratory tract, part unspecified causing other injury, initial encounter: Secondary | ICD-10-CM | POA: Diagnosis not present

## 2021-07-26 DIAGNOSIS — I462 Cardiac arrest due to underlying cardiac condition: Secondary | ICD-10-CM | POA: Diagnosis present

## 2021-07-26 DIAGNOSIS — Z87891 Personal history of nicotine dependence: Secondary | ICD-10-CM | POA: Diagnosis not present

## 2021-07-26 DIAGNOSIS — I4901 Ventricular fibrillation: Secondary | ICD-10-CM | POA: Diagnosis not present

## 2021-07-26 DIAGNOSIS — Z85828 Personal history of other malignant neoplasm of skin: Secondary | ICD-10-CM | POA: Diagnosis not present

## 2021-07-26 DIAGNOSIS — I213 ST elevation (STEMI) myocardial infarction of unspecified site: Secondary | ICD-10-CM | POA: Diagnosis present

## 2021-07-26 DIAGNOSIS — I471 Supraventricular tachycardia: Secondary | ICD-10-CM | POA: Diagnosis not present

## 2021-07-26 DIAGNOSIS — R112 Nausea with vomiting, unspecified: Secondary | ICD-10-CM | POA: Diagnosis not present

## 2021-07-26 DIAGNOSIS — R451 Restlessness and agitation: Secondary | ICD-10-CM | POA: Diagnosis not present

## 2021-07-26 DIAGNOSIS — Z7982 Long term (current) use of aspirin: Secondary | ICD-10-CM | POA: Diagnosis not present

## 2021-07-26 DIAGNOSIS — Z8249 Family history of ischemic heart disease and other diseases of the circulatory system: Secondary | ICD-10-CM | POA: Diagnosis not present

## 2021-07-26 DIAGNOSIS — I959 Hypotension, unspecified: Secondary | ICD-10-CM | POA: Diagnosis not present

## 2021-07-26 DIAGNOSIS — I2119 ST elevation (STEMI) myocardial infarction involving other coronary artery of inferior wall: Secondary | ICD-10-CM | POA: Diagnosis present

## 2021-07-26 DIAGNOSIS — Z91048 Other nonmedicinal substance allergy status: Secondary | ICD-10-CM | POA: Diagnosis not present

## 2021-07-26 DIAGNOSIS — E782 Mixed hyperlipidemia: Secondary | ICD-10-CM | POA: Diagnosis present

## 2021-07-26 DIAGNOSIS — I2510 Atherosclerotic heart disease of native coronary artery without angina pectoris: Secondary | ICD-10-CM | POA: Diagnosis present

## 2021-07-26 LAB — POCT ACTIVATED CLOTTING TIME: Activated Clotting Time: 269 seconds

## 2021-07-26 MED ORDER — POLYETHYLENE GLYCOL 3350 17 G PO PACK: 17.0000 g | PACK | Freq: Every day | ORAL | Status: DC | PRN

## 2021-07-26 MED ORDER — IOHEXOL 300 MG/ML  SOLN
INTRAMUSCULAR | Status: DC | PRN
  Administered 2021-07-26: 180 mL

## 2021-07-26 MED ORDER — FENTANYL 2500MCG IN NS 250ML (10MCG/ML) PREMIX INFUSION: 50.0000 ug/h | INTRAVENOUS | Status: DC

## 2021-07-26 MED ORDER — SODIUM CHLORIDE 0.9 % IV SOLN
INTRAVENOUS | Status: DC | PRN
  Administered 2021-07-26: 999 mL/h via INTRAVENOUS

## 2021-07-26 MED ORDER — DOCUSATE SODIUM 100 MG PO CAPS: 100.0000 mg | ORAL_CAPSULE | Freq: Two times a day (BID) | ORAL | Status: DC | PRN

## 2021-07-26 MED ORDER — PANTOPRAZOLE 2 MG/ML SUSPENSION: 40.0000 mg | Freq: Every day | ORAL | Status: DC

## 2021-07-26 MED ORDER — ATROPINE SULFATE 1 MG/10ML IJ SOSY
PREFILLED_SYRINGE | INTRAMUSCULAR | Status: AC
  Filled 2021-07-26: qty 10

## 2021-07-26 MED ORDER — HEPARIN SODIUM (PORCINE) 1000 UNIT/ML IJ SOLN
INTRAMUSCULAR | Status: AC
  Filled 2021-07-26: qty 10

## 2021-08-04 NOTE — ED Provider Notes (Signed)
[facility] Provider Note    Event Date/Time   First MD Initiated Contact with Patient 07/18/2021 2242     (approximate)   History   Chest Pain   HPI  Gregory Arroyo is a 81 y.o. male past medical history of skin cancer presents today with chest pain concern for STEMI in the field.  About 1 hour prior to arrival patient started having substernal chest pressure.  Was associated with generalized weakness in his lower extremities did not fall.  He was hypotensive EMS received some fluid and then got some nitroglycerin pain is significantly improved about 3 out of 10.  Denies abdominal pain back pain no dyspnea.  No history of cardiac issues.    Past Medical History:  Diagnosis Date   Basal cell carcinoma 11/04/2018   right antihelix   History of chicken pox    History of measles    Hx of squamous cell carcinoma of skin 02/15/2013   L ear concha   Squamous cell carcinoma of skin 02/15/2013   L ear concha    Patient Active Problem List   Diagnosis Date Noted   Popliteal cyst, right 05/17/2020   Spinal osteoarthritis 05/17/2020   Pain in both lower legs 05/16/2020   Mixed hyperlipidemia 03/21/2020   Avitaminosis D 03/21/2020   Bilateral impacted cerumen 03/21/2020   History of skin cancer 04/19/2019   Reactive airway disease 05/08/2015   H/O adenomatous polyp of colon 08/29/2014   Pre-diabetes 08/29/2014   Skin lesion of face 08/29/2014   Vertigo 08/29/2014   External hemorrhoids without complication 53/66/4403   Testicular hypofunction 05/06/2008   Family history of colon cancer 04/28/2008   Allergic rhinitis 04/27/2008   Decreased libido 04/27/2008     Physical Exam  Triage Vital Signs: ED Triage Vitals  Enc Vitals Group     BP 07/06/2021 2237 105/73     Pulse Rate 07/07/2021 2237 60     Resp 08/03/2021 2237 17     Temp 07/12/2021 2237 98.3 F (36.8 C)     Temp Source 07/05/2021 2237 Oral     SpO2 07/06/2021 2237 96 %     Weight 07/05/2021 2238  150 lb (68 kg)     Height 07/19/2021 2238 '5\' 6"'$  (1.676 m)     Head Circumference --      Peak Flow --      Pain Score 08/01/2021 2238 5     Pain Loc --      Pain Edu? --      Excl. in [place]? --     Most recent vital signs: Vitals:   07/22/2021 2237 07/11/2021 2245  BP: 105/73 120/78  Pulse: 60 65  Resp: 17 20  Temp: 98.3 F (36.8 C)   SpO2: 96% 96%     General: Awake, no distress.  CV:  Good peripheral perfusion.  No edema Resp:  Normal effort.  No increased work of breathing Abd:  No distention.  Neuro:             Awake, Alert, Oriented x 3  Other:     ED Results / Procedures / Treatments  Labs (all labs ordered are listed, but only abnormal results are displayed) Labs Reviewed  COMPREHENSIVE METABOLIC PANEL - Abnormal; Notable for the following components:      Result Value   Glucose, Bld 174 (*)    BUN 28 (*)    Creatinine, Ser 1.47 (*)    GFR, Estimated 48 (*)  All other components within normal limits  LIPID PANEL - Abnormal; Notable for the following components:   Triglycerides 250 (*)    HDL 31 (*)    VLDL 50 (*)    All other components within normal limits  TROPONIN I (HIGH SENSITIVITY) - Abnormal; Notable for the following components:   Troponin I (High Sensitivity) 408 (*)    All other components within normal limits  CBC WITH DIFFERENTIAL/PLATELET  PROTIME-INR  APTT  CBC WITH DIFFERENTIAL/PLATELET     EKG  EKG interpreted by myself shows ST elevation in inferior leads with reciprocal depressions in the lateral leads and V1 through V3   RADIOLOGY    PROCEDURES:  Critical Care performed: No  .1-3 Lead EKG Interpretation  Performed by: Rada Hay, MD Authorized by: Rada Hay, MD     Interpretation: normal     ECG rate assessment: normal     Rhythm: sinus rhythm     Ectopy: none     Conduction: normal     The patient is on the cardiac monitor to evaluate for evidence of arrhythmia and/or significant heart rate  changes.   MEDICATIONS ORDERED IN ED: Medications  0.9 %  sodium chloride infusion (has no administration in time range)  aspirin chewable tablet 324 mg ( Oral MAR Hold 07/13/2021 2256)  Heparin (Porcine) in NaCl 2000-0.9 UNIT/L-% SOLN (1,000 mLs  Given 07/18/2021 2302)  lidocaine (PF) (XYLOCAINE) 1 % injection (2 mLs Infiltration Given 07/19/2021 2304)  midazolam (VERSED) injection (1 mg Intravenous Given 07/21/2021 2305)  fentaNYL (SUBLIMAZE) injection (25 mcg Intravenous Given 07/19/2021 2305)  verapamil (ISOPTIN) injection (2.5 mg Intra-arterial Given 07/21/2021 2305)  heparin sodium (porcine) injection (5,000 Units Intravenous Given 08/25/2021 0001)  DOPamine (INTROPIN) 800 mg in dextrose 5 % 250 mL (3.2 mg/mL) infusion (10 mcg/kg/min  68 kg Intravenous Rate/Dose Change 07/11/2021 2357)  norepinephrine (LEVOPHED) 4 mg in dextrose 5 % 250 mL (0.016 mg/mL) infusion (10 mcg/min Intravenous Rate/Dose Change 08/25/21 0001)  heparin bolus via infusion 4,000 Units (4,000 Units Intravenous Bolus from Bag 07/29/2021 2240)  sodium chloride 0.9 % bolus 500 mL (500 mLs Intravenous New Bag/Given 07/09/2021 2247)     IMPRESSION / MDM / ASSESSMENT AND PLAN / ED COURSE  I reviewed the triage vital signs and the nursing notes.                              Patient's presentation is most consistent with acute presentation with potential threat to life or bodily function.  Differential diagnosis includes, but is not limited to, acute coronary syndrome, coronary dissection, Tak[place]ubo  Patient is an 81 year old who presents with a STEMI.  Initial field EKG concerning for inferior/posterior MI and EKG here is even more concerning for this.  Patient actually looks quite well here blood pressure mildly soft so given 500 cc of fluid.  Cath Lab activated.  Patient already received aspirin given 4000 of heparin.  Dr. Saralyn Pilar at bedside plans to take patient for cath.       FINAL CLINICAL IMPRESSION(S) / ED DIAGNOSES   Final  diagnoses:  ST elevation myocardial infarction (STEMI), unspecified artery ([place])     Rx / DC Orders   ED Discharge Orders     None        Note:  This document was prepared using Dragon voice recognition software and may include unintentional dictation errors.   Rada Hay, MD 08-25-21 [phone]

## 2021-08-04 NOTE — Progress Notes (Signed)
08-13-21 1420: Received a call from [facility] stating she was the patient's grand daughter and was getting information for her grandmother. She asked if patient was having an autopsy. Explained he was not. She requested to know why. I told her I could not release specifics of the patient record, but they make the decision based on some general considerations which can include patient's presentation to the hospital, suspected cause of death, any unusual circumstances and if they meet the criteria of homicide, suicide, overdose, accidental death, or suspicious circumstances. She then asked what would be the cause of death listed on the death certificate. I explained I could not release that information as it would be drawn from the hospital record. She had no further questions. Told her to call back if she needed to.  Informed provider, Dr. Saralyn Pilar, the family had made inquiries about the situation and cause of death.

## 2021-08-04 NOTE — ED Provider Notes (Signed)
[facility] Department of Emergency Medicine   Code Blue CONSULT NOTE  Chief Complaint: Cardiac arrest/unresponsive   Level V Caveat: Unresponsive  History of present illness: I was contacted by the hospital for a CODE BLUE cardiac arrest in the cath lab and presented to the patient's bedside.  Patient was actively undergoing a left heart catheterization in the Cath Lab when he vomited and aspirated, he became bradycardic and hypoxic and a CODE BLUE was called.  Upon arrival to the room patient was actively vomiting and not protecting his airway  ROS: Unable to obtain, Level V caveat  Scheduled Meds:  aspirin  324 mg Oral Once   Continuous Infusions:  sodium chloride     fentaNYL infusion INTRAVENOUS     PRN Meds:.iohexol Past Medical History:  Diagnosis Date   Basal cell carcinoma 11/04/2018   right antihelix   History of chicken pox    History of measles    Hx of squamous cell carcinoma of skin 02/15/2013   L ear concha   Squamous cell carcinoma of skin 02/15/2013   L ear concha   Past Surgical History:  Procedure Laterality Date   CATARACT EXTRACTION Right 04/2011   Dr. Redmond Baseman, [facility]   CIRCUMCISION  [phone]   HERNIA REPAIR Right 2010   Dr. Lemar Livings; Inguinal Hernia   LUMBAR SPINE SURGERY  2001   SHOULDER SURGERY Left 2009   Dr. 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, clinical  Tobacco Use   Smoking status: Former    Packs/day: 1.00    Years: 2.00    Total pack years: 2.00    Types: Cigarettes    Quit date: 02/04/1958    Years since quitting: 63.5   Smokeless tobacco: Never   Vaping Use   Vaping Use: Never used  Substance and Sexual Activity   Alcohol use: Yes    Alcohol/week: 1.0 standard drink of alcohol    Types: 1 Glasses of wine per week   Drug use: No   Sexual activity: Not on file  Other Topics Concern   Not on file  Social History Narrative   Not on file   Social Determinants of Health   Financial Resource Strain: Not on file  Food Insecurity: Not on file  Transportation Needs: Not on file  Physical Activity: Inactive (11/22/2019)   Exercise Vital Sign    Days of Exercise per Week: 0 days    Minutes of Exercise per Session: 0 min  Stress: Not on file  Social Connections: Not on file  Intimate Partner Violence: Not on file   Allergies  Allergen Reactions   Pollen Extract     Last set of Vital Signs (not current) Vitals:   08-07-21 0024 08/07/2021 0029  BP: (!) 168/58 (!) 147/94  Pulse: (!) 148 (!) 130  Resp: (!) 43 (!) 23  Temp:    SpO2: (!) 47%       Physical Exam  Gen: unresponsive Cardiovascular: pulseless  Resp: apneic. Breath sounds equal bilaterally with bagging  Abd: nondistended  Neuro: GCS 3, unresponsive to pain  HEENT: No blood  in posterior pharynx, gag reflex absent  Neck: No crepitus  Musculoskeletal: No deformity  Skin: warm  Procedures  INTUBATION Performed by: Nita Sickle Required items: required blood products, implants, devices, and special equipment available Patient identity confirmed: provided demographic data and hospital-assigned identification number Time out: Immediately prior to procedure a "time out" was called to verify the correct patient, procedure, equipment, support staff and site/side marked as required. Indications: airway protection Intubation method: Video laryngoscopy Preoxygenation: BVM Sedatives: etomidate Paralytic: Succinylcholine Tube Size: 7.5 cuffed Post-procedure assessment: chest rise and ETCO2 monitor Breath sounds: equal and absent over the epigastrium Tube  secured by Respiratory Therapy Patient tolerated the procedure well with no immediate complications.  CRITICAL CARE Performed by: Nita Sickle Total critical care time: 30 Critical care time was exclusive of separately billable procedures and treating other patients. Critical care was necessary to treat or prevent imminent or life-threatening deterioration. Critical care was time spent personally by me on the following activities: development of treatment plan with patient and/or surrogate as well as nursing, discussions with consultants, evaluation of patient's response to treatment, examination of patient, obtaining history from patient or surrogate, ordering and performing treatments and interventions, ordering and review of laboratory studies, ordering and review of radiographic studies, pulse oximetry and re-evaluation of patient's condition.  Cardiopulmonary Resuscitation (CPR) Procedure Note Performed and directed by cardiologist Dr.Paraschos  Medical Decision making  Patient sedated undergoing catheterization and started actively vomiting, aspirated.  Upon arrival to the ED no CPR was ongoing, patient had pulses and was being stented.  Airway was cleared and patient was intubated per procedure note above.  Care was then transferred back to cardiologist Dr. Liane Comber, [place], [place] 07/30/21 [phone]

## 2021-08-04 NOTE — Progress Notes (Signed)
08-09-2021: Lucky Cowboy, RN Hydrographic surveyor, contacted Philippa Sicks, reporting to [place]. Colletta Maryland reviewed the patients record with Mr. Curt Bears. Initially requested we reserve collected blood, he then called back and stated the [facility] Office of Medical Examiner would not accept him as an autopsy case. Colletta Maryland requested Mr. Coble contact the spouse as they had mentioned having requesting an autopsy

## 2021-08-04 NOTE — ED Notes (Signed)
Gearlean Alf RN was also present during the administration of the fentanyl drip

## 2021-08-04 NOTE — Progress Notes (Signed)
   25-Aug-2021 0200  Clinical Encounter Type  Visited With Patient and family together  Visit Type Initial  Referral From Nurse  Consult/Referral To Chaplain   Chaplain responded to Code Stemi that turned to a Code Blue and resulted in death. Chaplain provided support for wife and family throughout evening. Chaplain provided a compassionate presence and reflective listening as wife and family spoke about situation unfolding this evening. Chaplain provided grief support to family upon patient's death. Family appreciated Chaplain services.

## 2021-08-04 NOTE — ED Notes (Signed)
This RN responded to code blue called in cath lab, after intubation pt required sedation and was started on fentanyl drip per Dr. Alfred Levins verbal order. It was initiated at 59mg/hr and titrated as needed.  Fentanyl drip was pulled under override from Code Blue in ED pyxis as Fentanyl was not available in drip form in the cath lab.

## 2021-08-04 NOTE — Consult Note (Signed)
[facility]  CARDIOLOGY CONSULT NOTE  Patient ID: Gregory Arroyo MRN: [id number] DOB/AGE: [birth year] 81 y.o.  Admit date: 07/17/2021 Referring Physician Starleen Blue Primary Physician [facility] Primary Cardiologist  Reason for Consultation inferior posterior STEMI  HPI: 81 year old gentleman referred for inferior posterior STEMI.  The patient was used of health until 07/18/2021 at approximately 9:00 PM developed 9 out of 10 substernal chest pain.  Patient presented to [facility] emergency room where ECG revealed elevations in leads II, III and aVF V5 and V6 with 1 to 2 mm ST depression in leads V1 and V2 system with inferior posterior STEMI.  Patient was brought to the cardiac catheterization laboratory underwent cardiac catheterization which revealed 100% occlusion of the ostial left circumflex.  The patient became clinically and hemodynamically unstable with hypotension and bradycardia.  Transvenous pacemaker was placed and the patient was started on dopamine and Levophed infusion.  Patient became extremely agitated, nauseous and vomited x3 requiring sedation and intubation.  Patient underwent PCI, initially with balloon dilatation of the ostium of left circumflex lesion, however, patient remained unstable with worsening hypotension, and developed SVT.  Drug-eluting stent was placed in the ostium of left circumflex, with overlap of ostium of the left anterior descending coronary artery which was wired and balloon angioplasty performed.  Patient continued to deteriorate, with pulseless electrical activity and ventricular fibrillation requiring CPR, multiple doses of epinephrine and defibrillation.  Patient expired at [place].  Review of systems complete and found to be negative unless listed above     Past Medical History:  Diagnosis Date   Basal cell carcinoma 11/04/2018   right antihelix   History of chicken pox    History of measles    Hx of squamous cell carcinoma of skin 02/15/2013   L ear concha    Squamous cell carcinoma of skin 02/15/2013   L ear concha    Past Surgical History:  Procedure Laterality Date   CATARACT EXTRACTION Right 04/2011   Dr. Martie Round, [facility]   CIRCUMCISION  [phone]   HERNIA REPAIR Right 2010   Dr. Bary Castilla; Inguinal Hernia   LUMBAR SPINE SURGERY  2001   SHOULDER SURGERY Left 2009   Dr. Sabra Heck    Medications Prior to Admission  Medication Sig Dispense Refill Last Dose   aspirin 81 MG tablet Take 1 tablet by mouth daily.      Cetirizine HCl 10 MG CAPS Take 1 tablet by mouth daily.       clotrimazole (MYCELEX) 10 MG troche Take 1 tablet (10 mg total) by mouth 3 (three) times daily. 90 Troche 2    cyclobenzaprine (FLEXERIL) 5 MG tablet Take 1 tablet (5 mg total) by mouth at bedtime. Will cause drowsiness caution advised. 30 tablet 0    fluticasone (FLONASE) 50 MCG/ACT nasal spray Place 2 sprays into both nostrils daily. (Patient taking differently: Place 2 sprays into both nostrils daily. As needed) 16 g 5    ibuprofen (ADVIL) 600 MG tablet Take 1 tablet (600 mg total) by mouth 2 (two) times daily as needed for moderate pain or cramping (take with food may cause Gatric upset.). 30 tablet 0    loratadine (CLARITIN) 10 MG tablet Take 1 tablet by mouth daily as needed.      Multiple Vitamins-Minerals (SENIOR MULTIVITAMIN PLUS PO) Take by mouth daily.      ofloxacin (FLOXIN) 0.3 % OTIC solution Place 10 drops into the right ear daily. 5 mL 0    Omega-3 Fatty Acids (FISH OIL PO)  Take 1 capsule by mouth daily.      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  Tobacco Use   Smoking status: Former    Packs/day: 1.00    Years: 2.00    Total pack years: 2.00    Types: Cigarettes    Quit date: 02/04/1958    Years since quitting: 63.5   Smokeless tobacco: Never  Vaping Use   Vaping Use: Never used  Substance and Sexual Activity   Alcohol use: Yes    Alcohol/week: 1.0 standard drink of alcohol    Types: 1 Glasses of wine per week   Drug use: No   Sexual activity: Not on file  Other Topics Concern   Not on file  Social History Narrative   Not on file   Social Determinants of Health   Financial Resource Strain: Not on file  Food Insecurity: Not on file  Transportation Needs: Not on file  Physical Activity: Inactive (11/22/2019)   Exercise Vital Sign    Days of Exercise per Week: 0 days    Minutes of Exercise per Session: 0 min  Stress: Not on file  Social Connections: Not on file  Intimate Partner Violence: Not on file    Family History  Problem Relation Age of Onset   Stroke Mother    Heart disease Mother    Transient ischemic attack Mother    Heart attack Father    Heart disease Father    Colon cancer Sister 30      Review of systems complete and found to be negative unless listed above      PHYSICAL EXAM  General: Well developed, well nourished, in no acute distress HEENT:  Normocephalic and atramatic Neck:  No JVD.  Lungs: Clear bilaterally to auscultation and percussion. Heart: HRRR . Normal S1 and S2 without gallops or murmurs.  Abdomen: Bowel sounds are positive, abdomen soft and non-tender  Msk:  Back normal, normal gait. Normal strength and tone for age. Extremities: No clubbing, cyanosis or edema.   Neuro: Alert and oriented X 3. Psych:  Good affect, responds appropriately  Labs:   Lab Results  Component Value Date   WBC 7.9 07/20/2021   HGB 14.2 07/22/2021   HCT 44.0 07/06/2021   MCV 95.4 07/14/2021   PLT 242 07/30/2021    Recent Labs  Lab 07/21/2021 2241  NA 138  K 4.1  CL  105  CO2 26  BUN 28*  CREATININE 1.47*  CALCIUM 9.0  PROT 7.3  BILITOT 0.5  ALKPHOS 43  ALT 27  AST 33  GLUCOSE 174*   Lab Results  Component Value Date   TROPONINI <0.03 09/28/2015    Lab Results  Component Value Date   CHOL 180 07/09/2021   CHOL 213 (H) 03/27/2020   CHOL 200 (H) 11/17/2017   Lab Results  Component Value Date   HDL 31 (L) 07/09/2021   HDL 36 (L) 03/27/2020   HDL 39 (L) 11/17/2017   Lab Results  Component Value Date  [place] 99 08/02/2021   LDLCALC 152 (H) 03/27/2020   LDLCALC 96 11/17/2017   Lab Results  Component Value Date   TRIG 250 (H) 07/29/2021   TRIG 139 03/27/2020   TRIG 327 (H) 11/17/2017   Lab Results  Component Value Date   CHOLHDL 5.8 07/12/2021   CHOLHDL 5.9 (H) 03/27/2020   CHOLHDL 5.1 (H) 11/17/2017   No results found for: "LDLDIRECT"    Radiology: No results found.  EKG: Sinus rhythm with ST elevation in leads II, III and aVF, V4 5 and 6, with ST depressions V1 and V2  ASSESSMENT AND PLAN:   Inferior posterior STEMI, hypotension and bradycardia, with 100% occlusion ostium left circumflex, with unsuccessful PCI, requiring intubation, and unsuccessful CPR and ACLS.  Patient expired at [place].  Signed: Isaias Cowman MD,PhD, [facility] Aug 15, 2021, 12:51 AM

## 2021-08-04 DEATH — deceased

## 2021-08-06 IMAGING — CR DG LUMBAR SPINE COMPLETE 4+V
1 series · 5 of 5 positions shown · non-contrast
Comparison: None.

CLINICAL DATA: Low back pain with lower extremity radicular
symptoms

EXAM:
LUMBAR SPINE - COMPLETE 4+ VIEW

[Series 1: dg lumbar spine complete 4 +v · 0.14mm/px · 5 of 5 slices shown]
[im 1/5]
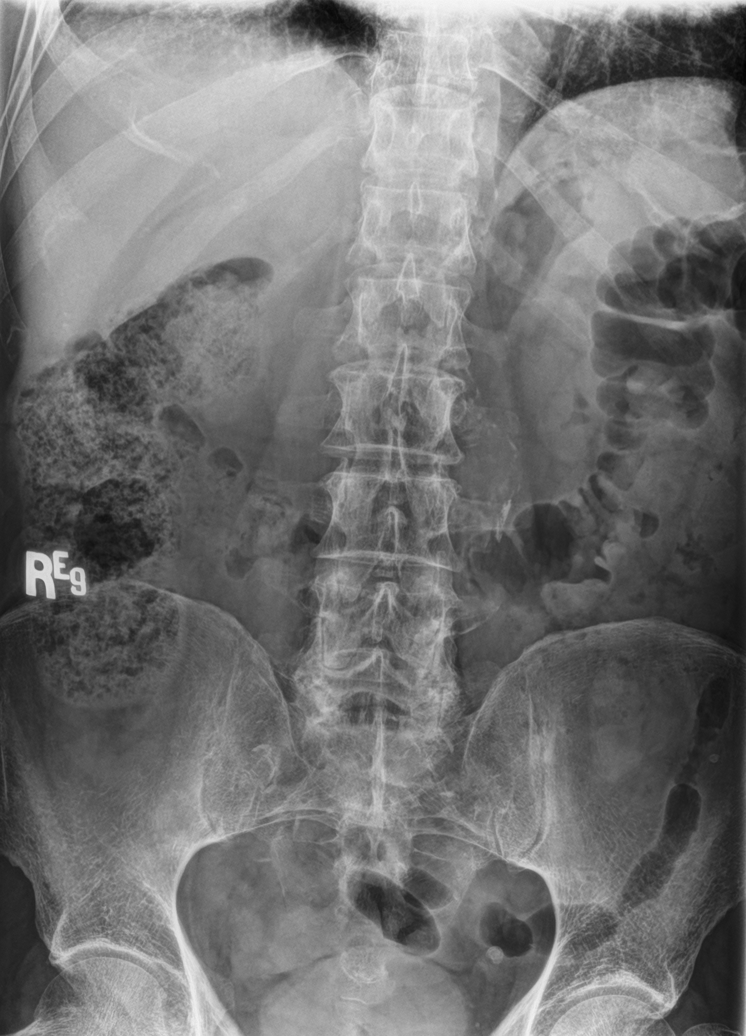
[im 2/5]
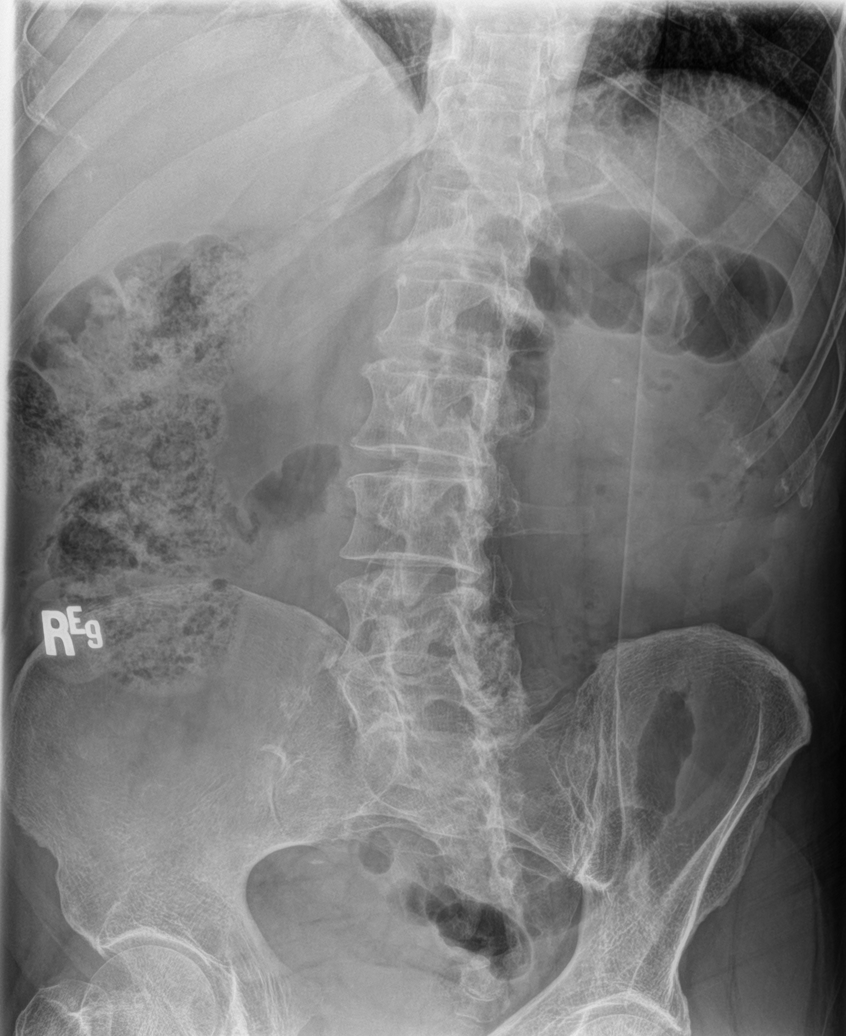
[im 3/5]
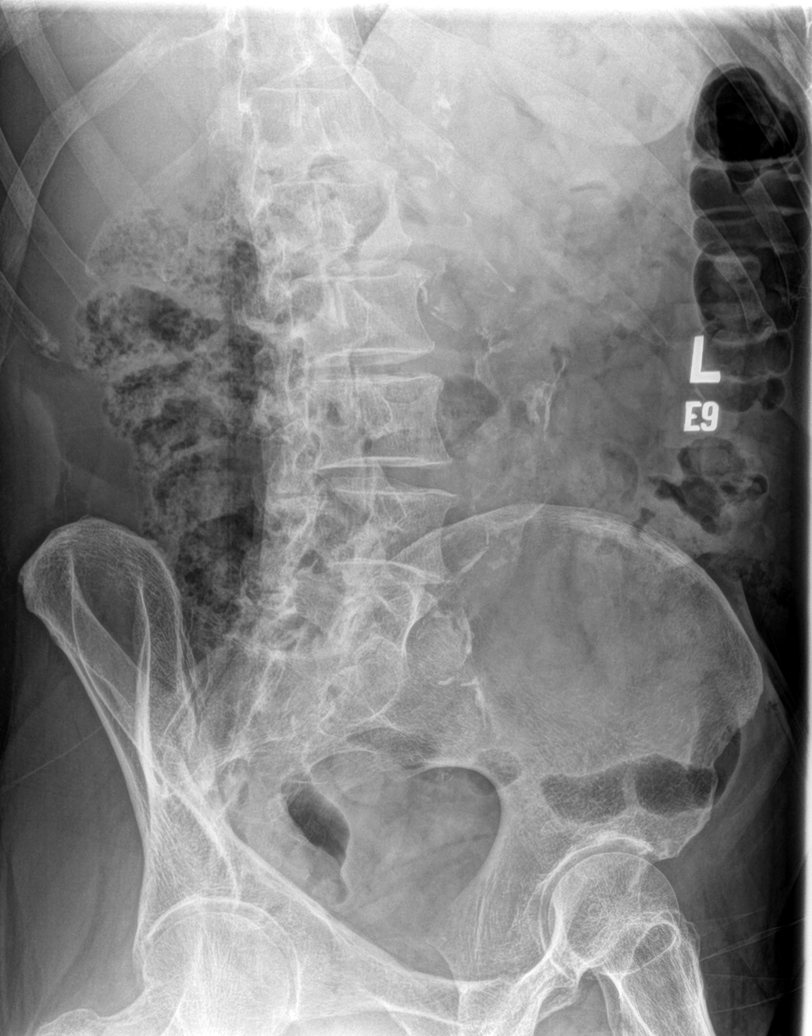
[im 4/5]
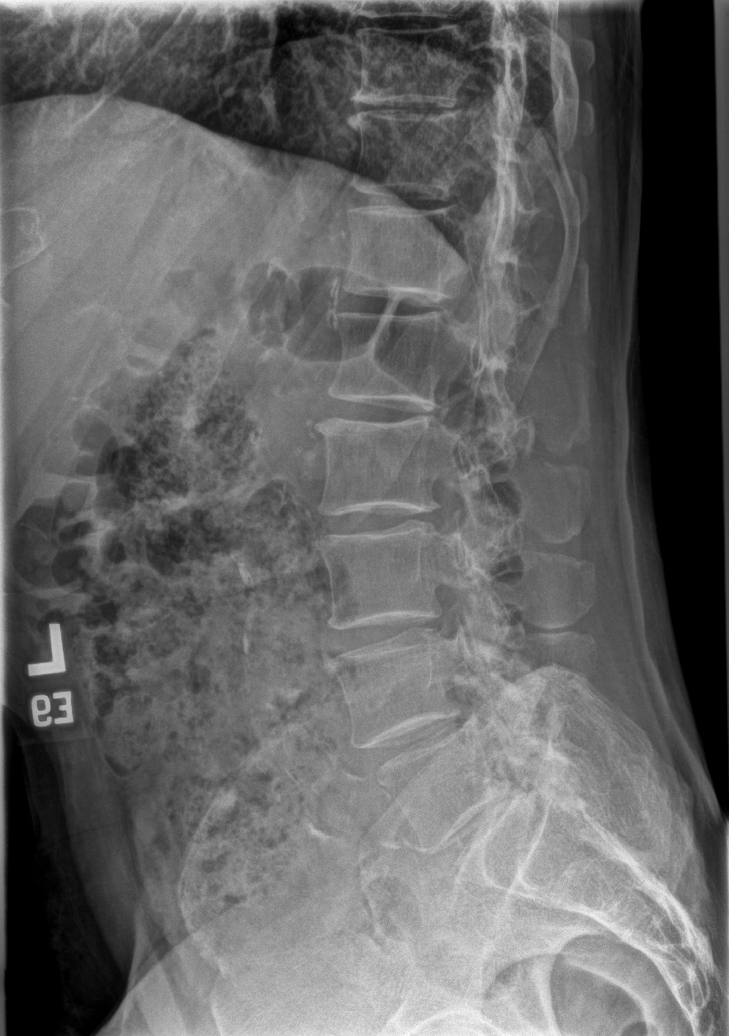
[im 5/5]
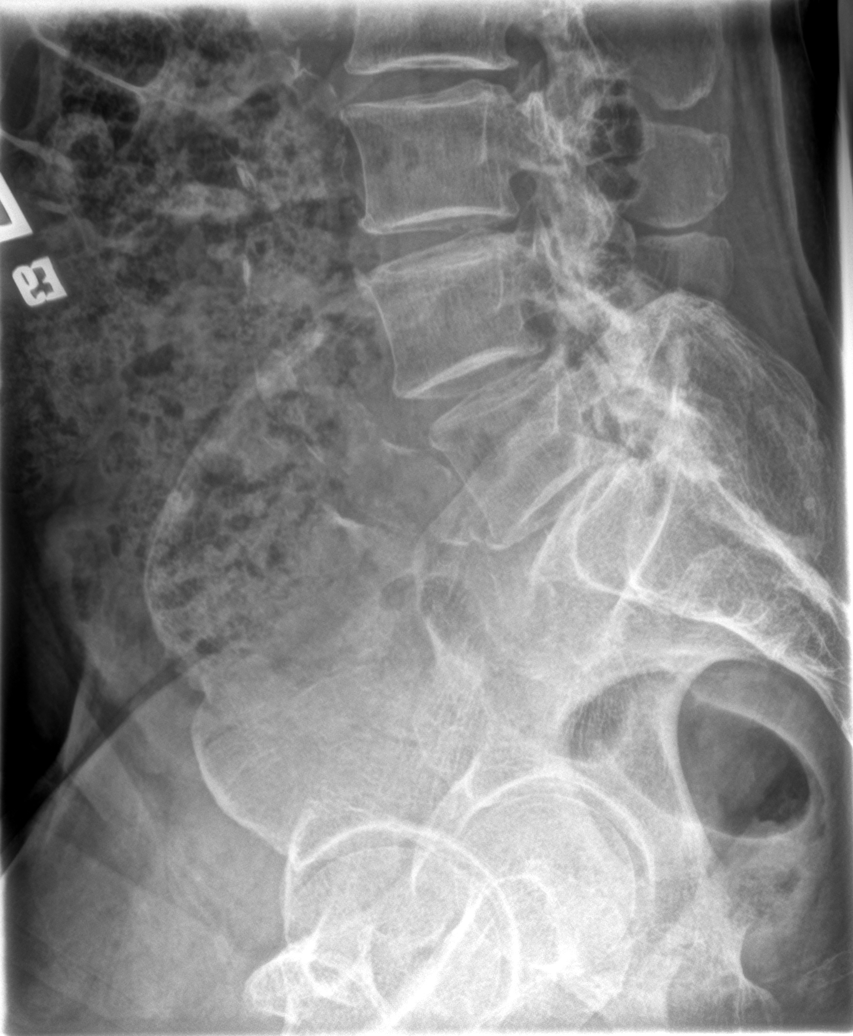

[5 of 5 positions shown; findings below may reference images not displayed]

FINDINGS: Frontal, lateral, spot lumbosacral lateral, and bilateral oblique
views were obtained. There are 5 non-rib-bearing lumbar type
vertebral bodies. There is no fracture or spondylolisthesis. There
is moderate disc space narrowing at L4-5 and L5-S1. There is
osteoarthritic change at L4-5 and L5-S1 bilaterally. There is aortic
atherosclerosis.
IMPRESSION: Osteoarthritic change at L4-5 and L5-S1. No fracture or
spondylolisthesis.

Aortic Atherosclerosis ([id number]-WZI.I).

## 2021-10-23 ENCOUNTER — Ambulatory Visit: Payer: Medicare Other | Admitting: Dermatology
